# Patient Record
Sex: Female | Born: 1965 | Race: White | Hispanic: No | Marital: Married | Smoking: Never smoker
Health system: Southern US, Community
[De-identification: ages and names within clinical notes are randomized; demographics above are authoritative.]

## PROBLEM LIST (undated history)

## (undated) DIAGNOSIS — Z800 Family history of malignant neoplasm of digestive organs: Secondary | ICD-10-CM

## (undated) DIAGNOSIS — D219 Benign neoplasm of connective and other soft tissue, unspecified: Secondary | ICD-10-CM

## (undated) DIAGNOSIS — R112 Nausea with vomiting, unspecified: Secondary | ICD-10-CM

## (undated) DIAGNOSIS — Z87898 Personal history of other specified conditions: Secondary | ICD-10-CM

## (undated) DIAGNOSIS — Z803 Family history of malignant neoplasm of breast: Secondary | ICD-10-CM

## (undated) DIAGNOSIS — J45909 Unspecified asthma, uncomplicated: Secondary | ICD-10-CM

## (undated) DIAGNOSIS — Z9889 Other specified postprocedural states: Secondary | ICD-10-CM

## (undated) DIAGNOSIS — F32A Depression, unspecified: Secondary | ICD-10-CM

## (undated) DIAGNOSIS — Z8051 Family history of malignant neoplasm of kidney: Secondary | ICD-10-CM

## (undated) DIAGNOSIS — H02409 Unspecified ptosis of unspecified eyelid: Secondary | ICD-10-CM

## (undated) DIAGNOSIS — F419 Anxiety disorder, unspecified: Secondary | ICD-10-CM

## (undated) DIAGNOSIS — I10 Essential (primary) hypertension: Secondary | ICD-10-CM

## (undated) DIAGNOSIS — N83209 Unspecified ovarian cyst, unspecified side: Secondary | ICD-10-CM

## (undated) DIAGNOSIS — Z8719 Personal history of other diseases of the digestive system: Secondary | ICD-10-CM

## (undated) DIAGNOSIS — K219 Gastro-esophageal reflux disease without esophagitis: Secondary | ICD-10-CM

## (undated) DIAGNOSIS — Z973 Presence of spectacles and contact lenses: Secondary | ICD-10-CM

## (undated) HISTORY — DX: Family history of malignant neoplasm of digestive organs: Z80.0

## (undated) HISTORY — DX: Family history of malignant neoplasm of breast: Z80.3

## (undated) HISTORY — DX: Family history of malignant neoplasm of kidney: Z80.51

---

## 2000-09-02 ENCOUNTER — Encounter: Payer: Self-pay | Admitting: Obstetrics and Gynecology

## 2000-09-02 ENCOUNTER — Ambulatory Visit (HOSPITAL_COMMUNITY): Admission: RE | Admit: 2000-09-02 | Discharge: 2000-09-02 | Payer: Self-pay | Admitting: Obstetrics and Gynecology

## 2000-09-30 ENCOUNTER — Encounter: Payer: Self-pay | Admitting: Obstetrics and Gynecology

## 2000-09-30 ENCOUNTER — Ambulatory Visit (HOSPITAL_COMMUNITY): Admission: RE | Admit: 2000-09-30 | Discharge: 2000-09-30 | Payer: Self-pay | Admitting: Obstetrics and Gynecology

## 2000-11-10 ENCOUNTER — Inpatient Hospital Stay (HOSPITAL_COMMUNITY): Admission: AD | Admit: 2000-11-10 | Discharge: 2000-11-10 | Payer: Self-pay | Admitting: Obstetrics and Gynecology

## 2000-12-05 ENCOUNTER — Inpatient Hospital Stay (HOSPITAL_COMMUNITY): Admission: AD | Admit: 2000-12-05 | Discharge: 2000-12-05 | Payer: Self-pay | Admitting: Obstetrics and Gynecology

## 2000-12-06 ENCOUNTER — Encounter: Payer: Self-pay | Admitting: Obstetrics and Gynecology

## 2000-12-06 ENCOUNTER — Ambulatory Visit (HOSPITAL_COMMUNITY): Admission: RE | Admit: 2000-12-06 | Discharge: 2000-12-06 | Payer: Self-pay | Admitting: Obstetrics and Gynecology

## 2000-12-27 ENCOUNTER — Ambulatory Visit (HOSPITAL_COMMUNITY): Admission: RE | Admit: 2000-12-27 | Discharge: 2000-12-27 | Payer: Self-pay | Admitting: Obstetrics and Gynecology

## 2000-12-27 ENCOUNTER — Encounter: Payer: Self-pay | Admitting: Obstetrics and Gynecology

## 2001-01-17 ENCOUNTER — Inpatient Hospital Stay (HOSPITAL_COMMUNITY): Admission: RE | Admit: 2001-01-17 | Discharge: 2001-01-19 | Payer: Self-pay | Admitting: Obstetrics and Gynecology

## 2001-01-17 ENCOUNTER — Encounter: Payer: Self-pay | Admitting: Obstetrics and Gynecology

## 2005-05-26 ENCOUNTER — Other Ambulatory Visit: Admission: RE | Admit: 2005-05-26 | Discharge: 2005-05-26 | Payer: Self-pay | Admitting: Obstetrics and Gynecology

## 2006-08-17 DIAGNOSIS — G839 Paralytic syndrome, unspecified: Secondary | ICD-10-CM

## 2006-08-17 HISTORY — DX: Paralytic syndrome, unspecified: G83.9

## 2006-08-17 HISTORY — PX: TUMOR REMOVAL: SHX12

## 2007-02-23 ENCOUNTER — Encounter: Admission: RE | Admit: 2007-02-23 | Discharge: 2007-02-23 | Payer: Self-pay | Admitting: Otolaryngology

## 2007-05-27 ENCOUNTER — Ambulatory Visit (HOSPITAL_BASED_OUTPATIENT_CLINIC_OR_DEPARTMENT_OTHER): Admission: RE | Admit: 2007-05-27 | Discharge: 2007-05-27 | Payer: Self-pay | Admitting: Otolaryngology

## 2007-12-07 ENCOUNTER — Encounter: Admission: RE | Admit: 2007-12-07 | Discharge: 2007-12-07 | Payer: Self-pay | Admitting: Otolaryngology

## 2008-11-23 ENCOUNTER — Encounter: Admission: RE | Admit: 2008-11-23 | Discharge: 2008-11-23 | Payer: Self-pay | Admitting: Otolaryngology

## 2010-12-30 NOTE — Op Note (Signed)
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   PROCEDURE:  Platinum left upper eyelid implant.   SURGEON:  Lucky Cowboy, MD   ANESTHESIA:  General.   ESTIMATED BLOOD LOSS:  None.   COMPLICATIONS:  None.   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  Although she has redeveloped some tone over the past 2 weeks on  the left side of her face, she remains without ability to close the left  eye resulting from the facial nerve paralysis.  The nerve was intact at  the end of the procedure, but it was very was very weakened and will  need time to recover.  In this interval time, left eye opening and  inability to close is causing her significant disability.  She is having  difficulty driving.  The left eye is constantly blurry and tearing with  frequent drying out and is very painful frequently.  For these reasons,  the above procedure is performed.   FINDINGS:  The patient had a 1.2-g platinum upper eyelid placed on the  left side.   PROCEDURE:  The patient was taken to the operating room and placed on  the table in the supine position.  She was then placed under general  endotracheal anesthesia and the left eye prepped with Betadine and  draped in the usual sterile fashion.  Lubrication was placed in the left  eye to protect the cornea.  A marking pen was used to make the  planned  area of incision in the left upper eyelid crease.  At this point, 1%  lidocaine with 1:100,000 of epinephrine was used to inject the planned  area of incision.  After allowing time for vasoconstrictive effect, a  #15 blade was used to make an incision in the eyelid crease.  The  orbicularis oculi was then divided using the needle-tip Bovie on a  setting of 10.  Once this was performed, the muscle was elevated off of  the tarsal plate.  A pocket was made inferiorly approximately 2 mm  superior to the lash line.  The weight was placed with the middle hole  up to keep this in place.  A 5-0 Monocryl was used to secure this plate  in the 2 lateral position and the superior medial position.  The the  muscle was then reapproximated in a simple interrupted buried fashion  using 5-0 Vicryl.  The skin was  closed  in a running stitch using 6-0 Prolene.  Maxitrol ointment was  applied all over the left eye.  The patient was awakened from anesthesia  and taken to the postanesthesia care unit in stable condition.  There  were no complications.      Lucky Cowboy, MD  Electronically Signed     SJ/MEDQ  D:  05/27/2007  T:  05/28/2007  Job:  [id number]   cc:   [facility]

## 2011-01-02 NOTE — Discharge Summary (Signed)
[facility]  Patient:    Angela Huff, Angela Huff                       MRN: [id number] Adm. Date:  [id number] Disc. Date: [id number] Attending:  Oliver Pila                           Discharge Summary  ADMISSION DIAGNOSES:          Intrauterine pregnancy at 38 weeks, oligohydramnios, group B strep carrier.  DISCHARGE DIAGNOSES:          Intrauterine pregnancy at 38 weeks, oligohydramnios, group B strep carrier.  PROCEDURE:                    Spontaneous vaginal delivery.  COMPLICATIONS:                None.  CONSULTATIONS:                None.  HISTORY AND PHYSICAL:         This is a 45 year old white female gravida 3, para 2-0-0-2 with an EGA of [redacted] weeks with a due date of June 15 by an LMP consistent with a first trimester ultrasound who presents for induction due to an ultrasound on the day of admission revealing oligohydramnios with an AFI of 4.  She has a history of precipitous labors.  Pregnancy complicated by exposure to parvo virus with normal ultrasounds throughout the pregnancy and she is a group B strep carrier.  PRENATAL LABORATORIES:        Blood type O- with negative antibody screen. RPR nonreactive.  Rubella immune.  Hepatitis B surface antigen negative.  HIV negative.  Gonorrhea and chlamydia were also negative.  PAST OBSTETRICAL HISTORY:     In 1994 vaginal delivery 6 pounds 15 ounces at term without complications.  In 1996 vaginal delivery at term 8 pounds 5 ounces without complications.  She only labored for two hours and had a perineal fistula postpartum.  PHYSICAL EXAMINATION  VITAL SIGNS:                  She is afebrile with stable vital signs.  Fetal heart tracing is reactive.  ABDOMEN:                      Gravid and nontender.  PELVIC:                       Cervix was 2, 50, -1.  On Dr. Loleta Books first examination artificial rupture of membranes performed and revealed clear amniotic fluid.  HOSPITAL COURSE:               Patient was admitted and had the above mentioned artificial rupture of membranes and was started on Pitocin.  She progressed to complete two hours after starting Pitocin and pushed well.  On June 3 she had an SVD of a vigorous female infant over an intact perineum with Apgars of 8 and 9 that weighed 6 pounds 13 ounces.  There was a compound presentation with the left arm easily reduced.  Placenta delivered spontaneous, was intact, and had a three vessel cord.  Estimated blood loss was 350 cc.  Cervix, rectum, and vagina were intact.  Postpartum she did very well.  Remained afebrile.  On the morning of postpartum day #2 she was felt to  be stable enough for discharge home.  Early on the morning of postpartum day #2 she had an episode of severe uterine cramping which resolved with Motrin and Percocet and she had previously been doing well.  CONDITION ON DISCHARGE:       Stable.  DISPOSITION:                  Discharged to home.  DIET:                         Regular diet.  ACTIVITY:                     Pelvic rest.  FOLLOW-UP:                    Six weeks.  DISCHARGE MEDICATIONS:        Percocet p.r.n. pain.  DISCHARGE INSTRUCTIONS:       She is given our discharge pamphlet. DD:  01/19/01 TD:  01/19/01 Job: [id number]

## 2011-05-28 LAB — POCT HEMOGLOBIN-HEMACUE
Hemoglobin: 10.9 — ABNORMAL LOW
Operator id: 12362

## 2013-08-17 HISTORY — PX: ENDOMETRIAL ABLATION: SHX621

## 2017-08-17 HISTORY — PX: COLONOSCOPY: SHX174

## 2018-08-17 DIAGNOSIS — C801 Malignant (primary) neoplasm, unspecified: Secondary | ICD-10-CM

## 2018-08-17 HISTORY — PX: MASTECTOMY: SHX3

## 2018-08-17 HISTORY — DX: Malignant (primary) neoplasm, unspecified: C80.1

## 2018-11-09 ENCOUNTER — Other Ambulatory Visit: Payer: Self-pay | Admitting: Obstetrics and Gynecology

## 2018-11-09 DIAGNOSIS — N6310 Unspecified lump in the right breast, unspecified quadrant: Secondary | ICD-10-CM

## 2018-11-14 ENCOUNTER — Ambulatory Visit
Admission: RE | Admit: 2018-11-14 | Discharge: 2018-11-14 | Disposition: A | Discharge status: Home / Self Care | Payer: 59 | Source: Ambulatory Visit | Attending: Obstetrics and Gynecology | Admitting: Obstetrics and Gynecology

## 2018-11-14 ENCOUNTER — Other Ambulatory Visit: Payer: Self-pay | Admitting: Obstetrics and Gynecology

## 2018-11-14 ENCOUNTER — Other Ambulatory Visit: Payer: Self-pay

## 2018-11-14 DIAGNOSIS — R921 Mammographic calcification found on diagnostic imaging of breast: Secondary | ICD-10-CM

## 2018-11-14 DIAGNOSIS — N6310 Unspecified lump in the right breast, unspecified quadrant: Secondary | ICD-10-CM

## 2018-11-15 ENCOUNTER — Ambulatory Visit
Admission: RE | Admit: 2018-11-15 | Discharge: 2018-11-15 | Disposition: A | Discharge status: Home / Self Care | Payer: 59 | Source: Ambulatory Visit | Attending: Obstetrics and Gynecology | Admitting: Obstetrics and Gynecology

## 2018-11-15 ENCOUNTER — Other Ambulatory Visit: Payer: Self-pay | Admitting: Obstetrics and Gynecology

## 2018-11-15 DIAGNOSIS — R921 Mammographic calcification found on diagnostic imaging of breast: Secondary | ICD-10-CM

## 2018-11-15 DIAGNOSIS — Z170 Estrogen receptor positive status [ER+]: Secondary | ICD-10-CM

## 2018-11-15 DIAGNOSIS — C50411 Malignant neoplasm of upper-outer quadrant of right female breast: Secondary | ICD-10-CM | POA: Insufficient documentation

## 2018-11-22 ENCOUNTER — Other Ambulatory Visit: Payer: Self-pay | Admitting: General Surgery

## 2018-11-22 ENCOUNTER — Encounter: Payer: Self-pay | Admitting: Genetic Counselor

## 2018-11-22 DIAGNOSIS — R928 Other abnormal and inconclusive findings on diagnostic imaging of breast: Secondary | ICD-10-CM

## 2018-11-22 DIAGNOSIS — C50411 Malignant neoplasm of upper-outer quadrant of right female breast: Secondary | ICD-10-CM

## 2018-11-22 DIAGNOSIS — Z170 Estrogen receptor positive status [ER+]: Secondary | ICD-10-CM

## 2018-11-23 ENCOUNTER — Encounter: Payer: Self-pay | Admitting: Adult Health

## 2018-11-24 ENCOUNTER — Encounter: Payer: Self-pay | Admitting: *Deleted

## 2018-11-24 ENCOUNTER — Telehealth: Payer: Self-pay | Admitting: Oncology

## 2018-11-24 NOTE — Telephone Encounter (Signed)
A new patient appt has been scheduled for the pt to see Dr. Jana Hakim on 4/13 at 1pm and genetics at 2pm. Pt aware to arrive 15 minutes early.

## 2018-11-25 NOTE — Progress Notes (Signed)
[place]  [phone] [phone]) [phone]    ID: [facility] DOB: December 12, 1965  MR#: [id number]  VEL#:[id number]  Patient Care Team: Lawerance Cruel, MD as PCP - General (Family Medicine) Mauro Kaufmann, RN as Oncology Nurse Navigator Rockwell Germany, RN as Oncology Nurse Navigator Naif Alabi, Virgie Dad, MD as Consulting Physician (Oncology) Stark Klein, MD as Consulting Physician (General Surgery) Paula Compton, MD as Consulting Physician (Obstetrics and Gynecology) Allyn Kenner, MD (Dermatology) OTHER MD:   CHIEF COMPLAINT: Ductal carcinoma in situ right breast  CURRENT TREATMENT: Tamoxifen   HISTORY OF CURRENT ILLNESS: [facility] had routine screening mammography on 11/01/2018 showing a possible abnormality in the right breast. She underwent unilateral right diagnostic mammography with tomography and right breast ultrasonography at [facility] on 11/14/2018 showing: Breast Density Category C. Grouped punctate and amorphous calcifications are confirmed within the lower inner quadrant of the right breast, at posterior depth, measuring 6 mm extent. Additional loosely grouped punctate and amorphous calcifications are confirmed within the upper-outer quadrant of the right breast and central/retroareolar right breast, measuring 4.5 cm greatest extent and 7 mm extent respectively.Targeted ultrasound is performed, evaluating the upper-outer quadrant of the right breast, showing scattered benign cysts, some simple and some complicated with internal debris, largest measuring 7 mm. No suspicious solid or cystic mass is identified by ultrasound. Right axilla was evaluated with ultrasound showing no enlarged or morphologically abnormal lymph nodes.  Accordingly on 11/15/2018 she proceeded to biopsy of the right breast area in question. The pathology from this procedure showed ([id number]): ductal carcinoma in situ, intermediate nuclear grade with  calcifications, arising in a complex sclerosing lesion. Prognostic indicators significant for: estrogen receptor, 95% positive and progesterone receptor, 95% positive, both with strong staining intensity.  An additional biopsy of the right breast was performed on the same day showing: 2. Breast, right, needle core biopsy, lower inner quadrant   - Flat epithelial atypia (FEA) with calcifications.   The patient's subsequent history is as detailed below.   INTERVAL HISTORY: [facility] was evaluated in the breast cancer clinic on 11/28/2018.  Her husband [facility] participated by speaker phone..   The patient met with Dr. Barry Dienes a few days prior to today's meeting and [facility] has been set up for additional breast biopsies.  She also has been set up to meet with genetics later today.   REVIEW OF SYSTEMS: [facility] found a biopsy is very uncomfortable.  Aside from that there were no specific symptoms leading to the original mammogram, which was routinely scheduled. The patient denies unusual headaches, visual changes, nausea, vomiting, stiff neck, dizziness, or gait imbalance. There has been no cough, phlegm production, or pleurisy, no chest pain or pressure, and no change in bowel or bladder habits. The patient denies fever, rash, bleeding, unexplained fatigue or unexplained weight loss. A detailed review of systems was otherwise entirely negative.   PAST MEDICAL HISTORY: Mild allergic asthma  PAST SURGICAL HISTORY: Schwannoma removal left facial nerve area 2008 Placement platinum weight left upper lid  FAMILY HISTORY: Family History  Problem Relation Age of Onset   Breast cancer Maternal Grandmother    Breast cancer Paternal Grandmother    The patient's father died from slowly but relentlessly invasive basal cell carcinoma at the age of 39.  The patient's mother is living as of April 2020, age 24.  The patient has 1 brother who died at the age of 32 from a drug overdose, 2 older brothers, and 1 sister,  with no history of cancer in the immediate family.  The patient's mother's mother was diagnosed with breast cancer at age 78 and died at age 5.  That grandmother had brothers, no sisters.   GYNECOLOGIC HISTORY:  No LMP recorded. 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  Her husband Angela Huff") works in Interior and spatial designer for [employer].  Their children are Angela Huff, 25, who is teaching [place] in [place]; Angela Huff, 23, who is home, and his son Angela Huff 17 who is home.   ADVANCED DIRECTIVES: The patient's husband is her healthcare power of attorney   HEALTH MAINTENANCE: Social History   Tobacco Use   Smoking status: Not on file  Substance Use Topics   Alcohol use: Not on file   Drug use: Not on file    Colonoscopy: [facility], 2019  PAP: Up-to-date/[facility]  Bone density: Never  Allergies no known allergies  Current Outpatient Medications  Medication Sig Dispense Refill   albuterol (PROVENTIL HFA;VENTOLIN HFA) 108 (90 Base) MCG/ACT inhaler albuterol sulfate HFA 90 mcg/actuation aerosol inhaler  INHALE 1 TO 2 PUFFS BY MOUTH EVERY 4 HOURS AS NEEDED     clotrimazole-betamethasone (LOTRISONE) cream clotrimazole-betamethasone 1 %-0.05 % topical cream  APPLY TO THE AFFECTED AND SURROUNDING AREAS OF SKIN BY TOPICAL ROUTE 2 TIMES PER DAY IN THE MORNING AND EVENING FOR 2 WEEKS     hydrochlorothiazide (MICROZIDE) 12.5 MG capsule hydrochlorothiazide 12.5 mg capsule     omeprazole (PRILOSEC OTC) 20 MG tablet Take by mouth.     tamoxifen (NOLVADEX) 20 MG tablet Take 1 tablet (20 mg total) by mouth daily for 30 days. 90 tablet 12   No current facility-administered medications for this visit.       OBJECTIVE: Middle-aged white woman who appears well  Vitals:   11/28/18 1302  BP: (!) 147/94  Pulse: 94  Resp: 20  Temp: 98.7 F (37.1 C)  SpO2: 97%     Body mass index is 29.88 kg/m.   Wt Readings from Last 3 Encounters:  11/28/18 196 lb 8 oz (89.1 kg)      ECOG FS:0 - Asymptomatic  Ocular: Sclerae unicteric, pupils round and equal Ear-nose-throat: Oropharynx clear and moist Lymphatic: No cervical or supraclavicular adenopathy Lungs no rales or rhonchi Heart regular rate and rhythm Abd soft, nontender, positive bowel sounds MSK no focal spinal tenderness, no joint edema Neuro: non-focal, well-oriented, appropriate affect Breasts: The right breast is status post recent biopsy.  There is no ecchymosis.  There is no palpable mass.  There are no skin or nipple changes of concern.  The left breast is benign.  Both axillae are benign.   LAB RESULTS:  CMP  No results found for: NA, K, CL, CO2, GLUCOSE, BUN, CREATININE, CALCIUM, PROT, ALBUMIN, AST, ALT, ALKPHOS, BILITOT, GFRNONAA, GFRAA  No results found for: TOTALPROTELP, ALBUMINELP, A1GS, A2GS, BETS, BETA2SER, GAMS, MSPIKE, SPEI  No results found for: KPAFRELGTCHN, LAMBDASER, KAPLAMBRATIO  Lab Results  Component Value Date   HGB 10.9 (L) 05/27/2007    @LASTCHEMISTRY @  No results found for: LABCA2  No components found for: [id number]  No results for input(s): INR in the last 168 hours.  No results found for: LABCA2  No results found for: BJY782  No results found for: NFA213  No results found for: YQM578  No results found for: CA2729  No components found for: HGQUANT  No results found for: CEA1 / No results found for: CEA1   No results found for: AFPTUMOR  No results found for: CHROMOGRNA  No results found for: PSA1  No visits with results within 3 Day(s) from this visit.  Latest known visit with results is:  Hospital Outpatient Visit on 05/27/2007  Component Date Value Ref Range Status    Operator id 05/27/2007 [id number]   Final   Hemoglobin 05/27/2007 10.9*  Final    (this displays the last labs from the last 3 days)  No results found for: TOTALPROTELP, ALBUMINELP, A1GS, A2GS, BETS, BETA2SER, GAMS, MSPIKE, SPEI (this displays SPEP labs)  No results found for: KPAFRELGTCHN, LAMBDASER, KAPLAMBRATIO (kappa/lambda light chains)  No results found for: HGBA, HGBA2QUANT, HGBFQUANT, HGBSQUAN (Hemoglobinopathy evaluation)   No results found for: LDH  No results found for: IRON, TIBC, IRONPCTSAT (Iron and TIBC)  No results found for: FERRITIN  Urinalysis No results found for: COLORURINE, APPEARANCEUR, LABSPEC, PHURINE, GLUCOSEU, HGBUR, BILIRUBINUR, KETONESUR, PROTEINUR, UROBILINOGEN, NITRITE, LEUKOCYTESUR   STUDIES:  US Breast Ltd Uni Right Inc Axilla  Result Date: 11/14/2018 CLINICAL DATA:  Patient returns today to evaluate possible mass and calcifications within the RIGHT breast identified on recent screening mammogram. EXAM: DIGITAL DIAGNOSTIC RIGHT MAMMOGRAM WITH CAD AND TOMO ULTRASOUND RIGHT BREAST COMPARISON:  Previous exams including recent screening mammogram dated 11/01/2018. ACR Breast Density Category c: The breast tissue is heterogeneously dense, which may obscure small masses. FINDINGS: Grouped punctate and amorphous calcifications are confirmed within the lower inner quadrant of the RIGHT breast, at posterior depth, measuring 6 mm extent. Additional loosely grouped punctate and amorphous calcifications are confirmed within the upper-outer quadrant of the RIGHT breast and central/retroareolar RIGHT breast, measuring 4.5 cm greatest extent and 7 mm extent respectively. Mammographic images were processed with CAD. Targeted ultrasound is performed, evaluating the upper-outer quadrant of the RIGHT breast, showing scattered benign cysts, some simple and some complicated with internal debris, largest measuring 7 mm. No suspicious solid or cystic mass is identified by  ultrasound. RIGHT axilla was evaluated with ultrasound showing no enlarged or morphologically abnormal lymph nodes. IMPRESSION: 1. Indeterminate calcifications within the upper-outer quadrant of the RIGHT breast. These calcifications span 4.5 cm greatest dimension. Given the scattered cysts identified by ultrasound in the upper-outer quadrant of the RIGHT breast, these calcifications may represent benign fibrocystic change. Stereotactic biopsy is recommended. 2. Additional indeterminate calcifications within the lower inner quadrant of the RIGHT breast, at far posterior depth, measuring 6 mm extent. Stereotactic biopsy is also recommended for these calcifications. RECOMMENDATION: 1. Stereotactic biopsy for the indeterminate calcifications within the upper-outer quadrant of the RIGHT breast. If benign fibrocystic change, would then recommend follow-up RIGHT breast diagnostic mammogram in 6 months to ensure stability of additional surrounding calcifications in the upper-outer quadrant and retroareolar RIGHT breast. 2. Stereotactic biopsy for the indeterminate calcifications within the lower inner quadrant of the RIGHT breast. Stereotactic biopsies are scheduled for March 31st. I have discussed the findings and recommendations with the patient. Results were also provided in writing at the conclusion of the visit. If applicable, a reminder letter will be sent to the patient regarding the next appointment. BI-RADS CATEGORY  4: Suspicious. Electronically Signed   By: Franki Cabot M.D.   On: 11/14/2018 12:12   Mm Diag Breast Tomo Uni Right  Result  Date: 11/14/2018 CLINICAL DATA:  Patient returns today to evaluate possible mass and calcifications within the RIGHT breast identified on recent screening mammogram. EXAM: DIGITAL DIAGNOSTIC RIGHT MAMMOGRAM WITH CAD AND TOMO ULTRASOUND RIGHT BREAST COMPARISON:  Previous exams including recent screening mammogram dated 11/01/2018. ACR Breast Density Category c: The breast  tissue is heterogeneously dense, which may obscure small masses. FINDINGS: Grouped punctate and amorphous calcifications are confirmed within the lower inner quadrant of the RIGHT breast, at posterior depth, measuring 6 mm extent. Additional loosely grouped punctate and amorphous calcifications are confirmed within the upper-outer quadrant of the RIGHT breast and central/retroareolar RIGHT breast, measuring 4.5 cm greatest extent and 7 mm extent respectively. Mammographic images were processed with CAD. Targeted ultrasound is performed, evaluating the upper-outer quadrant of the RIGHT breast, showing scattered benign cysts, some simple and some complicated with internal debris, largest measuring 7 mm. No suspicious solid or cystic mass is identified by ultrasound. RIGHT axilla was evaluated with ultrasound showing no enlarged or morphologically abnormal lymph nodes. IMPRESSION: 1. Indeterminate calcifications within the upper-outer quadrant of the RIGHT breast. These calcifications span 4.5 cm greatest dimension. Given the scattered cysts identified by ultrasound in the upper-outer quadrant of the RIGHT breast, these calcifications may represent benign fibrocystic change. Stereotactic biopsy is recommended. 2. Additional indeterminate calcifications within the lower inner quadrant of the RIGHT breast, at far posterior depth, measuring 6 mm extent. Stereotactic biopsy is also recommended for these calcifications. RECOMMENDATION: 1. Stereotactic biopsy for the indeterminate calcifications within the upper-outer quadrant of the RIGHT breast. If benign fibrocystic change, would then recommend follow-up RIGHT breast diagnostic mammogram in 6 months to ensure stability of additional surrounding calcifications in the upper-outer quadrant and retroareolar RIGHT breast. 2. Stereotactic biopsy for the indeterminate calcifications within the lower inner quadrant of the RIGHT breast. Stereotactic biopsies are scheduled for  March 31st. I have discussed the findings and recommendations with the patient. Results were also provided in writing at the conclusion of the visit. If applicable, a reminder letter will be sent to the patient regarding the next appointment. BI-RADS CATEGORY  4: Suspicious. Electronically Signed   By: Franki Cabot M.D.   On: 11/14/2018 12:12   Mm Clip Placement Right  Result Date: 11/15/2018 CLINICAL DATA:  Status post stereotactic biopsies for 2 sites of calcifications within the RIGHT breast. EXAM: DIAGNOSTIC RIGHT MAMMOGRAM POST STEREOTACTIC BIOPSY x2 COMPARISON:  Previous exam(s). FINDINGS: Mammographic images were obtained following stereotactic guided biopsy of 2 sites of calcifications within the RIGHT breast. Coil shaped clip is appropriately positioned within the upper-outer quadrant. X shaped clip is appropriately positioned within the lower inner quadrant. IMPRESSION: 1. Coil shaped clip is appropriately positioned within the upper-outer quadrant of the RIGHT breast. 2. X shaped clip is appropriately positioned within the lower inner quadrant of the RIGHT breast. Final Assessment: Post Procedure Mammograms for Marker Placement Electronically Signed   By: Franki Cabot M.D.   On: 11/15/2018 11:53   Mm Rt Breast Bx W Loc Dev 1st Lesion Image Bx Spec Stereo Guide  Addendum Date: 11/17/2018   ADDENDUM REPORT: 11/17/2018 13:54 ADDENDUM: Pathology revealed DUCTAL CARCINOMA IN SITU, INTERMEDIATE NUCLEAR GRADE WITH CALCIFICATIONS, ARISING IN A COMPLEX SCLEROSING LESION of the RIGHT breast, upper outer quadrant. This was found to be concordant by Dr. Franki Cabot. Pathology revealed FLAT EPITHELIAL ATYPIA (FEA) WITH CALCIFICATIONS of the RIGHT breast, lower inner quadrant. This was found to be concordant by Dr. Franki Cabot. Pathology results were discussed with the patient by  telephone. The patient reported doing well after the biopsy with tenderness at the site. Post biopsy instructions and care were  reviewed and questions were answered. The patient was encouraged to call [facility] for any additional concerns. If breast conservation therapy is considered, at least 2 additional stereotactic biopsies are recommended for other similar appearing calcifications in the RIGHT breast. Surgical consultation has been arranged with Dr. Stark Klein at [facility] on November 22, 2018. Pathology results reported by Stacie Acres, RN on 11/17/2018. Electronically Signed   By: Franki Cabot M.D.   On: 11/17/2018 13:54   Result Date: 11/17/2018 CLINICAL DATA:  Patient with 2 groups of calcifications within the RIGHT breast presents today for stereotactic biopsies. EXAM: RIGHT BREAST STEREOTACTIC CORE NEEDLE BIOPSY x2 COMPARISON:  Previous exams. FINDINGS: The patient and I discussed the procedure of stereotactic-guided biopsy including benefits and alternatives. We discussed the high likelihood of a successful procedure. We discussed the risks of the procedure including infection, bleeding, tissue injury, clip migration, and inadequate sampling. Informed written consent was given. The usual time out protocol was performed immediately prior to the procedure. Site 1: Using sterile technique and 1% Lidocaine as local anesthetic, under stereotactic guidance, a 9 gauge vacuum assisted device was used to perform core needle biopsy of calcifications in the upper outer quadrant of the RIGHT breast using a lateral approach. Specimen radiograph was performed showing calcifications. Specimens with calcifications are identified for pathology. Lesion quadrant: Upper outer quadrant At the conclusion of the procedure, a coil shaped tissue marker clip was deployed into the biopsy cavity. Site 2: Next, using sterile technique and 1% Lidocaine as local anesthetic, under stereotactic guidance, a 9 gauge vacuum assisted device was used to perform core needle biopsy of calcifications in the lower inner quadrant  of the RIGHT breast using a medial approach. Specimen radiograph was performed showing calcifications. Specimens with calcifications are identified for pathology. Lesion quadrant: Lower inner quadrant At the conclusion of the procedure, a X shaped tissue marker clip was deployed into the biopsy cavity. Follow-up 2-view mammogram was performed and dictated separately. IMPRESSION: 1. Stereotactic guided biopsy of calcifications within the upper-outer quadrant of the RIGHT breast. Coil shaped clip placed at the biopsy site. 2. Stereotactic guided biopsy of calcifications within the lower inner quadrant of the RIGHT breast. X shaped clip placed at the biopsy site. Electronically Signed: By: Franki Cabot M.D. On: 11/15/2018 11:52   Mm Rt Breast Bx W Loc Dev Ea Ad Lesion Img Bx Spec Stereo Guide  Addendum Date: 11/17/2018   ADDENDUM REPORT: 11/17/2018 13:54 ADDENDUM: Pathology revealed DUCTAL CARCINOMA IN SITU, INTERMEDIATE NUCLEAR GRADE WITH CALCIFICATIONS, ARISING IN A COMPLEX SCLEROSING LESION of the RIGHT breast, upper outer quadrant. This was found to be concordant by Dr. Franki Cabot. Pathology revealed FLAT EPITHELIAL ATYPIA (FEA) WITH CALCIFICATIONS of the RIGHT breast, lower inner quadrant. This was found to be concordant by Dr. Franki Cabot. Pathology results were discussed with the patient by telephone. The patient reported doing well after the biopsy with tenderness at the site. Post biopsy instructions and care were reviewed and questions were answered. The patient was encouraged to call [facility] for any additional concerns. If breast conservation therapy is considered, at least 2 additional stereotactic biopsies are recommended for other similar appearing calcifications in the RIGHT breast. Surgical consultation has been arranged with Dr. Stark Klein at [facility] on November 22, 2018. Pathology results reported by  Stacie Acres, RN on 11/17/2018. Electronically  Signed   By: Franki Cabot M.D.   On: 11/17/2018 13:54   Result Date: 11/17/2018 CLINICAL DATA:  Patient with 2 groups of calcifications within the RIGHT breast presents today for stereotactic biopsies. EXAM: RIGHT BREAST STEREOTACTIC CORE NEEDLE BIOPSY x2 COMPARISON:  Previous exams. FINDINGS: The patient and I discussed the procedure of stereotactic-guided biopsy including benefits and alternatives. We discussed the high likelihood of a successful procedure. We discussed the risks of the procedure including infection, bleeding, tissue injury, clip migration, and inadequate sampling. Informed written consent was given. The usual time out protocol was performed immediately prior to the procedure. Site 1: Using sterile technique and 1% Lidocaine as local anesthetic, under stereotactic guidance, a 9 gauge vacuum assisted device was used to perform core needle biopsy of calcifications in the upper outer quadrant of the RIGHT breast using a lateral approach. Specimen radiograph was performed showing calcifications. Specimens with calcifications are identified for pathology. Lesion quadrant: Upper outer quadrant At the conclusion of the procedure, a coil shaped tissue marker clip was deployed into the biopsy cavity. Site 2: Next, using sterile technique and 1% Lidocaine as local anesthetic, under stereotactic guidance, a 9 gauge vacuum assisted device was used to perform core needle biopsy of calcifications in the lower inner quadrant of the RIGHT breast using a medial approach. Specimen radiograph was performed showing calcifications. Specimens with calcifications are identified for pathology. Lesion quadrant: Lower inner quadrant At the conclusion of the procedure, a X shaped tissue marker clip was deployed into the biopsy cavity. Follow-up 2-view mammogram was performed and dictated separately. IMPRESSION: 1. Stereotactic guided biopsy of calcifications within the upper-outer quadrant of the RIGHT breast. Coil shaped  clip placed at the biopsy site. 2. Stereotactic guided biopsy of calcifications within the lower inner quadrant of the RIGHT breast. X shaped clip placed at the biopsy site. Electronically Signed: By: Franki Cabot M.D. On: 11/15/2018 11:52     ELIGIBLE FOR AVAILABLE RESEARCH PROTOCOL: NO   ASSESSMENT: 53 y.o. [place], [place] woman status post right breast upper outer quadrant biopsy 11/15/2018 showing ductal carcinoma in situ, grade 2, strongly estrogen and progesterone receptor positive  (a) biopsy of a lower inner quadrant area of calcifications 11/15/2018 showed flat epithelial atypia  (1) genetics testing 11/28/2018  (2) tamoxifen started 11/28/2018  (3) definitive surgery to follow  (4) adjuvant radiation to follow surgery   PLAN: I spent approximately 60 minutes face to face with Kayren with more than 50% of that time spent in counseling and coordination of care. Specifically we reviewed the biology of the patient's diagnosis and the specifics of her situation.  Bernadene understands that in noninvasive ductal carcinoma, also called ductal carcinoma in situ ("DCIS") the breast cancer cells remain trapped in the ducts were they started. They cannot travel to a vital organ. For that reason these cancers in themselves are not life-threatening.  If the whole breast is removed then all the ducts are removed and since the cancer cells are trapped in the ducts, the cure rate with mastectomy for noninvasive breast cancer is approximately 99%. Nevertheless we recommend lumpectomy, because there is no survival advantage to mastectomy and because the cosmetic result is generally superior with breast conservation.  Since the patient is keeping her breasts, there will be some risk of recurrence. The recurrence can only be in the same breast since, again, the cells are trapped in the ducts. There is no connection from one breast to the  other. The risk of local recurrence is cut by more than half with  radiation, which is standard in this situation.  In estrogen receptor positive cancers like Dasha's, anti-estrogens can also be considered. They will further reduce the risk of recurrence by one half. In addition anti-estrogens will lower the risk of a Angela breast cancer developing in either breast, also by one half.   [facility] also qualifies for genetics testing. In patients who carry a deleterious mutation [for example in a  BRCA gene], the risk of a Angela breast cancer developing in the future may be sufficiently great that the patient may choose bilateral mastectomies. However if she wishes to keep her breasts in that situation it is safe to do so. That would require intensified screening, which generally means not only yearly mammography but a yearly breast MRI as well.   Because of the current pandemic, the patient's surgery and radiation are likely to be delayed.  Accordingly we discussed antiestrogens today.  Since the patient is still premenopausal this will be tamoxifen and today we discussed the possible toxicities side effects and complications of this agent in detail including the low but non-0 risk of blood clots and uterine cancer.  The patient is very interested in starting and the prescription has been entered.  I am going to do a WebEx visit with Angela Huff in about 6 weeks just to make sure she is tolerating tamoxifen well.  I will then see her again once she completes her surgery and radiation and at that point we will set her up for yearly follow-up  Barbar has a good understanding of the overall plan. She agrees with it. She knows the goal of treatment in her case is cure. She will call with any problems that may develop before her next visit here.    Adrijana Haros, Virgie Dad, MD  11/28/18 2:21 PM Medical Oncology and Hematology [facility] [address] [place], [place] [address] Tel. [phone]    Fax. [phone]   I, Jacqualyn Posey am acting as a Education administrator for Chauncey Cruel, MD.   I, Lurline Del MD, have reviewed the above documentation for accuracy and completeness, and I agree with the above.

## 2018-11-28 ENCOUNTER — Inpatient Hospital Stay: Payer: 59 | Attending: Oncology | Admitting: Oncology

## 2018-11-28 ENCOUNTER — Inpatient Hospital Stay: Payer: 59 | Admitting: Genetic Counselor

## 2018-11-28 ENCOUNTER — Other Ambulatory Visit: Payer: Self-pay

## 2018-11-28 ENCOUNTER — Encounter: Payer: Self-pay | Admitting: *Deleted

## 2018-11-28 ENCOUNTER — Other Ambulatory Visit: Payer: 59

## 2018-11-28 ENCOUNTER — Encounter: Payer: 59 | Admitting: Genetic Counselor

## 2018-11-28 ENCOUNTER — Inpatient Hospital Stay: Payer: 59

## 2018-11-28 VITALS — BP 147/94 | HR 94 | Temp 98.7°F | Resp 20 | Ht 68.0 in | Wt 196.5 lb

## 2018-11-28 DIAGNOSIS — D0511 Intraductal carcinoma in situ of right breast: Secondary | ICD-10-CM | POA: Diagnosis not present

## 2018-11-28 DIAGNOSIS — Z800 Family history of malignant neoplasm of digestive organs: Secondary | ICD-10-CM

## 2018-11-28 DIAGNOSIS — C50411 Malignant neoplasm of upper-outer quadrant of right female breast: Secondary | ICD-10-CM

## 2018-11-28 DIAGNOSIS — Z808 Family history of malignant neoplasm of other organs or systems: Secondary | ICD-10-CM | POA: Insufficient documentation

## 2018-11-28 DIAGNOSIS — Z803 Family history of malignant neoplasm of breast: Secondary | ICD-10-CM

## 2018-11-28 DIAGNOSIS — Z170 Estrogen receptor positive status [ER+]: Secondary | ICD-10-CM | POA: Diagnosis not present

## 2018-11-28 DIAGNOSIS — Z79899 Other long term (current) drug therapy: Secondary | ICD-10-CM | POA: Insufficient documentation

## 2018-11-28 DIAGNOSIS — Z8051 Family history of malignant neoplasm of kidney: Secondary | ICD-10-CM

## 2018-11-28 MED ORDER — TAMOXIFEN CITRATE 20 MG PO TABS: 20.0000 mg | ORAL_TABLET | Freq: Every day | ORAL | 12 refills | Status: AC

## 2018-11-29 ENCOUNTER — Encounter: Payer: Self-pay | Admitting: Genetic Counselor

## 2018-11-29 DIAGNOSIS — Z800 Family history of malignant neoplasm of digestive organs: Secondary | ICD-10-CM | POA: Insufficient documentation

## 2018-11-29 DIAGNOSIS — Z8051 Family history of malignant neoplasm of kidney: Secondary | ICD-10-CM | POA: Insufficient documentation

## 2018-11-29 DIAGNOSIS — Z803 Family history of malignant neoplasm of breast: Secondary | ICD-10-CM | POA: Insufficient documentation

## 2018-11-29 NOTE — Progress Notes (Signed)
REFERRING PROVIDER: Stark Klein, MD [address] [place], [place] [address]  PRIMARY PROVIDER:  Lawerance Cruel, MD  PRIMARY REASON FOR VISIT:  1. Malignant neoplasm of upper-outer quadrant of right breast in female, estrogen receptor positive ([place])   2. Family history of breast cancer   3. Family history of stomach cancer   4. Family history of kidney cancer      HISTORY OF PRESENT ILLNESS:  I connected with Ms. Sandora on 11/29/2018 at 3:00 PM EDT by Webex video conference and verified that I am speaking with the correct person using two identifiers.   Ms. Mukherjee, a 53 y.o. female, was seen for a [facility] cancer genetics consultation at the request of Dr. Barry Dienes due to a personal and family history of cancer.  Ms. Neria presents to clinic today to discuss the possibility of a hereditary predisposition to cancer, genetic testing, and to further clarify her future cancer risks, as well as potential cancer risks for family members.   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 The treatment plan includes anti-estrogen and lumpectomy once the pandemic concern has lessened and elective surgeries are reinstated.    CANCER HISTORY:    Malignant neoplasm of upper-outer quadrant of right breast in female, estrogen receptor positive ([place])   11/15/2018 Initial Diagnosis    Malignant neoplasm of upper-outer quadrant of right breast in female, estrogen receptor positive ([place])    11/23/2018 Cancer Staging    Staging form: Breast, AJCC 8th Edition - Clinical: Stage 0 (cTis (DCIS), cN0, cM0, ER+, PR+) - Signed by Gardenia Phlegm, NP on 11/23/2018      RISK FACTORS:  Menarche was at age 11.  First live birth at age 44.  OCP use for approximately 0 years.  Ovaries intact: no.  Hysterectomy: no.  Menopausal status: perimenopausal.  HRT use: 0 years. Colonoscopy: Yes; normal. Mammogram within the last year: yes. Number of  breast biopsies: 1. Up to date with pelvic exams: yes. Any excessive radiation exposure in the past: no  Past Medical History:  Diagnosis Date   Family history of breast cancer    Family history of kidney cancer    Family history of stomach cancer      Social History   Socioeconomic History   Marital status: Married    Spouse name: Not on file   Number of children: Not on file   Years of education: Not on file   Highest education level: Not on file  Occupational History   Not on file  Social Needs   Financial resource strain: Not on file   Food insecurity:    Worry: Not on file    Inability: Not on file   Transportation needs:    Medical: Not on file    Non-medical: Not on file  Tobacco Use   Smoking status: Not on file  Substance and Sexual Activity   Alcohol use: Not on file   Drug use: Not on file   Sexual activity: Not on file  Lifestyle   Physical activity:    Days per week: Not on file    Minutes per session: Not on file   Stress: Not on file  Relationships   Social connections:    Talks on phone: Not on file    Gets together: Not on file    Attends religious service: Not on file    Active member of club  or organization: Not on file    Attends meetings of clubs or organizations: Not on file    Relationship status: Not on file  Other Topics Concern   Not on file  Social History Narrative   Not on file     FAMILY HISTORY:  We obtained a detailed, 4-generation family history.  Significant diagnoses are listed below: Family History  Problem Relation Age of Onset   Breast cancer Maternal Grandmother 32       d. 4   Stomach cancer Paternal Grandmother 34   Skin cancer Paternal Grandmother    Skin cancer Father 25       d. 76   Kidney cancer Brother    Heart Problems Paternal Uncle    Drug abuse Brother        d. 36   Breast cancer Maternal Great-grandmother 37       MGF's mother    The patient has three children,  two daughters and a son, who are all cancer free.  She has three brothers and a sister.  One brother had kidney cancer in his 41's, and one brother died from a drug overdose.  Her mother is living and father is deceased.  The patient's mother has two brothers who are cancer free.  Her parents are both deceased.  Her mother was diagnosed with breast cancer at 13 and died at 46.  Her father died of heart disease in his 3's.  The maternal grandfather's mother had breast cancer at age 87.  The patient's father died of an aggressive basal cell carcinoma that developed at age 80 and he died at 92.  He had a sister and brother, and a maternal half brother.  None of his siblings had cancer.  His mother had stomach and skin cancer.  Ms. Alcivar is unaware of previous family history of genetic testing for hereditary cancer risks. Patient's maternal ancestors are of [place] descent, and paternal ancestors are of [place], [place] and [place] descent. There is no reported Ashkenazi Jewish ancestry. There is no known consanguinity.  GENETIC COUNSELING ASSESSMENT: Ms. Boateng is a 53 y.o. female with a personal and family history of cancer which is somewhat suggestive of a hereditary cancer syndrome and predisposition to cancer. We, therefore, discussed and recommended the following at today's visit.   DISCUSSION: We discussed that 5 - 10% of breast cancer is hereditary, with most cases associated with BRCA mutations.  There are other genes that can be associated with hereditary breast cancer syndromes.  She also has a family history of young kidney cancer in her brother and stomach cancer in her grandmother.  Both of these cancers can be seen in Lynch syndrome.  In some families, breast cancer can also be seen in Lynch syndrome.    We reviewed the characteristics, features and inheritance patterns of hereditary cancer syndromes. We also discussed genetic testing, including the appropriate family members to test, the  process of testing, insurance coverage and turn-around-time for results. We discussed the implications of a negative, positive and/or variant of uncertain significant result. We recommended Ms. Keir pursue genetic testing for the Multi-cancer gene panel. The Multi-Gene Panel offered by [facility] includes sequencing and/or deletion duplication testing of the following 85 genes: AIP, ALK, APC, ATM, AXIN2,BAP1,  BARD1, BLM, BMPR1A, BRCA1, BRCA2, BRIP1, CASR, CDC73, CDH1, CDK4, CDKN1B, CDKN1C, CDKN2A (p14ARF), CDKN2A (p16INK4a), CEBPA, CHEK2, CTNNA1, DICER1, DIS3L2, EGFR (c.2369C>T, p.Thr790Met variant only), EPCAM (Deletion/duplication testing only), FH, FLCN, GATA2, GPC3, GREM1 (Promoter region deletion/duplication  testing only), HOXB13 (c.251G>A, p.Gly84Glu), HRAS, KIT, MAX, MEN1, MET, MITF (c.952G>A, p.Glu318Lys variant only), MLH1, MSH2, MSH3, MSH6, MUTYH, NBN, NF1, NF2, NTHL1, PALB2, PDGFRA, PHOX2B, PMS2, POLD1, POLE, POT1, PRKAR1A, PTCH1, PTEN, RAD50, RAD51C, RAD51D, RB1, RECQL4, RET, RNF43, RUNX1, SDHAF2, SDHA (sequence changes only), SDHB, SDHC, SDHD, SMAD4, SMARCA4, SMARCB1, SMARCE1, STK11, SUFU, TERC, TERT, TMEM127, TP53, TSC1, TSC2, VHL, WRN and WT1.    Based on Ms. Simonin's personal and family history of cancer, she meets medical criteria for genetic testing. Despite that she meets criteria, she may still have an out of pocket cost. We discussed that if her out of pocket cost for testing is over $100, the laboratory will call and confirm whether she wants to proceed with testing.  If the out of pocket cost of testing is less than $100 she will be billed by the genetic testing laboratory.   In order to estimate her chance of having a BRCA mutation, we used statistical models (Penn II) that consider her personal medical history, family history and ancestry.  Because each model is different, there can be a lot of variability in the risks they give.  Therefore, these numbers must be considered a rough range  and not a precise risk of having a BRCA mutation.  This model estimates that she has approximately a 15% chance of having a mutation. Based on this assessment of her family and personal history, genetic testing is recommended.  PLAN: After considering the risks, benefits, and limitations, Ms. Rozar provided informed consent to pursue genetic testing and the blood sample was sent to [facility] for analysis of the Multi-cancer panel. Results should be available within approximately 2-3 weeks' time, at which point they will be disclosed by telephone to Ms. Penny, as will any additional recommendations warranted by these results. Ms. Ewart will receive a summary of her genetic counseling visit and a copy of her results once available. This information will also be available in Epic.   Lastly, we encouraged Ms. Toops to remain in contact with cancer genetics annually so that we can continuously update the family history and inform her of any changes in cancer genetics and testing that may be of benefit for this family.   Ms. Snavely questions were answered to her satisfaction today. Our contact information was provided should additional questions or concerns arise. Thank you for the referral and allowing [place] to share in the care of your patient.   Amari Zagal P. Florene Glen, [place], [facility] Certified Genetic Counselor [email or web address] phone: [phone]  The patient was seen for a total of 42 minutes in face-to-face genetic counseling.  This patient was discussed with Drs. Magrinat, Lindi Adie and/or Burr Medico who agrees with the above.    _______________________________________________________________________ For Office Staff:  Number of people involved in session: 2 Was an Intern/ student involved with case: yes: [employer]

## 2018-12-06 ENCOUNTER — Ambulatory Visit: Payer: Self-pay | Admitting: Genetic Counselor

## 2018-12-06 ENCOUNTER — Telehealth: Payer: Self-pay | Admitting: Genetic Counselor

## 2018-12-06 ENCOUNTER — Encounter: Payer: Self-pay | Admitting: Genetic Counselor

## 2018-12-06 DIAGNOSIS — Z1379 Encounter for other screening for genetic and chromosomal anomalies: Secondary | ICD-10-CM

## 2018-12-06 NOTE — Progress Notes (Signed)
HPI:  Ms. Wawrzyniak was previously seen in the [facility] due to a personal and family history of cancer and concerns regarding a hereditary predisposition to cancer. Please refer to our prior cancer genetics clinic note for more information regarding our discussion, assessment and recommendations, at the time. Ms. Adan recent genetic test results were disclosed to her, as were recommendations warranted by these results. These results and recommendations are discussed in more detail below.  CANCER HISTORY:    Malignant neoplasm of upper-outer quadrant of right breast in female, estrogen receptor positive ([place])   11/15/2018 Initial Diagnosis    Malignant neoplasm of upper-outer quadrant of right breast in female, estrogen receptor positive ([place])    11/23/2018 Cancer Staging    Staging form: Breast, AJCC 8th Edition - Clinical: Stage 0 (cTis (DCIS), cN0, cM0, ER+, PR+) - Signed by Gardenia Phlegm, NP on 11/23/2018    12/05/2018 Genetic Testing    Negative genetic testing.  CASR c.1211T>G VUS identified.  The Multi-Gene Panel offered by [facility] includes sequencing and/or deletion duplication testing of the following 85 genes: AIP, ALK, APC, ATM, AXIN2,BAP1,  BARD1, BLM, BMPR1A, BRCA1, BRCA2, BRIP1, CASR, CDC73, CDH1, CDK4, CDKN1B, CDKN1C, CDKN2A (p14ARF), CDKN2A (p16INK4a), CEBPA, CHEK2, CTNNA1, DICER1, DIS3L2, EGFR (c.2369C>T, p.Thr790Met variant only), EPCAM (Deletion/duplication testing only), FH, FLCN, GATA2, GPC3, GREM1 (Promoter region deletion/duplication testing only), HOXB13 (c.251G>A, p.Gly84Glu), HRAS, KIT, MAX, MEN1, MET, MITF (c.952G>A, p.Glu318Lys variant only), MLH1, MSH2, MSH3, MSH6, MUTYH, NBN, NF1, NF2, NTHL1, PALB2, PDGFRA, PHOX2B, PMS2, POLD1, POLE, POT1, PRKAR1A, PTCH1, PTEN, RAD50, RAD51C, RAD51D, RB1, RECQL4, RET, RNF43, RUNX1, SDHAF2, SDHA (sequence changes only), SDHB, SDHC, SDHD, SMAD4, SMARCA4, SMARCB1, SMARCE1, STK11, SUFU, TERC, TERT, TMEM127, TP53,  TSC1, TSC2, VHL, WRN and WT1.  The report date is December 05, 2018.     FAMILY HISTORY:  We obtained a detailed, 4-generation family history.  Significant diagnoses are listed below: Family History  Problem Relation Age of Onset   Breast cancer Maternal Grandmother 32       d. 55   Stomach cancer Paternal Grandmother 13   Skin cancer Paternal Grandmother    Skin cancer Father 59       d. 4   Kidney cancer Brother    Heart Problems Paternal Uncle    Drug abuse Brother        d. 37   Breast cancer Maternal Great-grandmother 69       MGF's mother    The patient has three children, two daughters and a son, who are all cancer free.  She has three brothers and a sister.  One brother had kidney cancer in his 3's, and one brother died from a drug overdose.  Her mother is living and father is deceased.  The patient's mother has two brothers who are cancer free.  Her parents are both deceased.  Her mother was diagnosed with breast cancer at 49 and died at 51.  Her father died of heart disease in his 61's.  The maternal grandfather's mother had breast cancer at age 54.  The patient's father died of an aggressive basal cell carcinoma that developed at age 76 and he died at 77.  He had a sister and brother, and a maternal half brother.  None of his siblings had cancer.  His mother had stomach and skin cancer.  Ms. Cerra is unaware of previous family history of genetic testing for hereditary cancer risks. Patient's maternal ancestors are of [place] descent, and paternal ancestors  are of [place], [place] and [place] descent. There is no reported Ashkenazi Jewish ancestry. There is no known consanguinity.   GENETIC TEST RESULTS: Genetic testing reported out on December 05, 2018 through the Multi-cancer panel found no pathogenic mutations. The Multi-Gene Panel offered by [facility] includes sequencing and/or deletion duplication testing of the following 85 genes: AIP, ALK, APC, ATM, AXIN2,BAP1,   BARD1, BLM, BMPR1A, BRCA1, BRCA2, BRIP1, CASR, CDC73, CDH1, CDK4, CDKN1B, CDKN1C, CDKN2A (p14ARF), CDKN2A (p16INK4a), CEBPA, CHEK2, CTNNA1, DICER1, DIS3L2, EGFR (c.2369C>T, p.Thr790Met variant only), EPCAM (Deletion/duplication testing only), FH, FLCN, GATA2, GPC3, GREM1 (Promoter region deletion/duplication testing only), HOXB13 (c.251G>A, p.Gly84Glu), HRAS, KIT, MAX, MEN1, MET, MITF (c.952G>A, p.Glu318Lys variant only), MLH1, MSH2, MSH3, MSH6, MUTYH, NBN, NF1, NF2, NTHL1, PALB2, PDGFRA, PHOX2B, PMS2, POLD1, POLE, POT1, PRKAR1A, PTCH1, PTEN, RAD50, RAD51C, RAD51D, RB1, RECQL4, RET, RNF43, RUNX1, SDHAF2, SDHA (sequence changes only), SDHB, SDHC, SDHD, SMAD4, SMARCA4, SMARCB1, SMARCE1, STK11, SUFU, TERC, TERT, TMEM127, TP53, TSC1, TSC2, VHL, WRN and WT1.  The test report has been scanned into EPIC and is located under the Molecular Pathology section of the Results Review tab.  A portion of the result report is included below for reference.     We discussed with Ms. Lewellyn that because current genetic testing is not perfect, it is possible there may be a gene mutation in one of these genes that current testing cannot detect, but that chance is small.  We also discussed, that there could be another gene that has not yet been discovered, or that we have not yet tested, that is responsible for the cancer diagnoses in the family. It is also possible there is a hereditary cause for the cancer in the family that Ms. Bogosian did not inherit and therefore was not identified in her testing.  Therefore, it is important to remain in touch with cancer genetics in the future so that we can continue to offer Ms. Nilan the most up to date genetic testing.   Genetic testing did identify a variant of uncertain significance (VUS) was identified in the CASR gene called c.1211T>G.  At this time, it is unknown if this variant is associated with increased cancer risk or if this is a normal finding, but most variants such as this get  reclassified to being inconsequential. It should not be used to make medical management decisions. With time, we suspect the lab will determine the significance of this variant, if any. If we do learn more about it, we will try to contact Ms. Rudden to discuss it further. However, it is important to stay in touch with [place] periodically and keep the address and phone number up to date.  ADDITIONAL GENETIC TESTING: We discussed with Ms. Salvo that her genetic testing was fairly extensive.  If there are genes identified to increase cancer risk that can be analyzed in the future, we would be happy to discuss and coordinate this testing at that time.    CANCER SCREENING RECOMMENDATIONS: Ms. Abood test result is considered negative (normal).  This means that we have not identified a hereditary cause for her personal and family history of cancer at this time. Most cancers happen by chance and this negative test suggests that her cancer may fall into this category.    While reassuring, this does not definitively rule out a hereditary predisposition to cancer. It is still possible that there could be genetic mutations that are undetectable by current technology. There could be genetic mutations in genes that have not been tested or  identified to increase cancer risk.  Therefore, it is recommended she continue to follow the cancer management and screening guidelines provided by her oncology and primary healthcare provider.   An individual's cancer risk and medical management are not determined by genetic test results alone. Overall cancer risk assessment incorporates additional factors, including personal medical history, family history, and any available genetic information that may result in a personalized plan for cancer prevention and surveillance  RECOMMENDATIONS FOR FAMILY MEMBERS:  Individuals in this family might be at some increased risk of developing cancer, over the general population risk, simply due  to the family history of cancer.  We recommended women in this family have a yearly mammogram beginning at age 39, or 109 years younger than the earliest onset of cancer, an annual clinical breast exam, and perform monthly breast self-exams. Women in this family should also have a gynecological exam as recommended by their primary provider. All family members should have a colonoscopy by age 50.  FOLLOW-UP: Lastly, we discussed with Ms. Petron that cancer genetics is a rapidly advancing field and it is possible that new genetic tests will be appropriate for her and/or her family members in the future. We encouraged her to remain in contact with cancer genetics on an annual basis so we can update her personal and family histories and let her know of advances in cancer genetics that may benefit this family.   Our contact number was provided. Ms. Uzelac questions were answered to her satisfaction, and she knows she is welcome to call us at anytime with additional questions or concerns.   Roma Kayser, MS, [facility] Certified Genetic Counselor [email or web address]

## 2018-12-06 NOTE — Telephone Encounter (Signed)
Revealed negative genetic testing.  Discussed that we do not know why she has breast cancer or why there is cancer in the family. It could be due to a different gene that we are not testing, or maybe our current technology may not be able to pick something up.  It will be important for her to keep in contact with genetics to keep up with whether additional testing may be needed. 

## 2018-12-09 ENCOUNTER — Ambulatory Visit
Admission: RE | Admit: 2018-12-09 | Discharge: 2018-12-09 | Disposition: A | Discharge status: Home / Self Care | Payer: 59 | Source: Ambulatory Visit | Attending: General Surgery | Admitting: General Surgery

## 2018-12-09 ENCOUNTER — Other Ambulatory Visit: Payer: Self-pay | Admitting: General Surgery

## 2018-12-09 ENCOUNTER — Other Ambulatory Visit: Payer: Self-pay

## 2018-12-09 DIAGNOSIS — C50411 Malignant neoplasm of upper-outer quadrant of right female breast: Secondary | ICD-10-CM

## 2018-12-09 DIAGNOSIS — Z170 Estrogen receptor positive status [ER+]: Secondary | ICD-10-CM

## 2018-12-09 DIAGNOSIS — R928 Other abnormal and inconclusive findings on diagnostic imaging of breast: Secondary | ICD-10-CM

## 2018-12-12 ENCOUNTER — Telehealth: Payer: Self-pay | Admitting: *Deleted

## 2018-12-13 NOTE — Telephone Encounter (Signed)
error 

## 2019-01-11 ENCOUNTER — Telehealth: Payer: Self-pay | Admitting: Oncology

## 2019-01-11 NOTE — Telephone Encounter (Signed)
Called patient regarding upcoming Webex appointment, per patient's request appointment has been rescheduled to 06/02. This will still be a Webex visit, patient is notified and an e-mail has been sent.   Message to provider.

## 2019-01-12 ENCOUNTER — Inpatient Hospital Stay: Payer: 59 | Admitting: Oncology

## 2019-01-16 NOTE — Progress Notes (Signed)
Angela Huff  [phone] [phone]    ID: Angela Huff DOB: [birth year]  MR#: [id number]  DTO#:[id number]  Patient Care Team: Lawerance Cruel, MD as PCP - General (Family Medicine) Mauro Kaufmann, RN as Oncology Nurse Navigator Rockwell Germany, RN as Oncology Nurse Navigator Tiandra Swoveland, Virgie Dad, MD as Consulting Physician (Oncology) Stark Klein, MD as Consulting Physician (General Surgery) Paula Compton, MD as Consulting Physician (Obstetrics and Gynecology) Allyn Kenner, MD (Dermatology) OTHER MD:  I connected with Angela Huff on 01/17/19 at  9:00 AM EDT by video enabled telemedicine visit and verified that I am speaking with the correct person using two identifiers.   I discussed the limitations, risks, security and privacy concerns of performing an evaluation and management service by telemedicine and the availability of in-person appointments. I also discussed with the patient that there may be a patient responsible charge related to this service. The patient expressed understanding and agreed to proceed.   Other persons participating in the visit and their role in the encounter: Husband Angela Huff; Angela Huff, Angela Huff   Patients location: home  Providers location: [place]: Ductal carcinoma in situ right breast  CURRENT TREATMENT: Tamoxifen   INTERVAL HISTORY: Angela Huff is seen today for follow up of her ductal carcinoma in situ.  She continues on tamoxifen with good tolerance. She reports a few hot flashes and denies vaginal discharge. She has not had regular periods because of a previous ablation, but she continues to experience spotting. She does not pay for it at this time.  Since her last visit, her genetics testing results came back, which were negative. A variant of uncertain significance was identified within CASR.  She also underwent two additional right breast biopsies on 12/09/2018.  Pathology from the procedure ([id number]) revealed: atypical ductal hyperplasia with calcifications within both samples, located in the lower-inner quadrant and the upper-outer quadrant.  She is scheduled to meet with Dr. Barry Dienes on 01/23/2019, who she has not seen since April.   REVIEW OF SYSTEMS: Angela Huff reports doing okay overall. Her mother passed away very unexpectedly over [place] Day weekend. [name] has not been exercising, but she has instead been using her quarantine time to fix up things around her house that she hasn't been able to do. She and her husband are celebrating their 31st anniversary on 01/25/2019. Their youngest son is graduating high school this month. Her husband works in [employer], so he has worked from home the last 4 years.  The patient denies unusual headaches, visual changes, nausea, vomiting, stiff neck, dizziness, or gait imbalance. There has been no cough, phlegm production, or pleurisy, no chest pain or pressure, and no change in bowel or bladder habits. The patient denies fever, rash, bleeding, unexplained fatigue or unexplained weight loss. A detailed review of systems was otherwise entirely negative.   HISTORY OF CURRENT ILLNESS: Angela Huff had routine screening mammography on 11/01/2018 showing a possible abnormality in the right breast. She underwent unilateral right diagnostic mammography with tomography and right breast ultrasonography at [facility] on 11/14/2018 showing: Breast Density Category C. Grouped punctate and amorphous calcifications are confirmed within the lower inner quadrant of the right breast, at posterior depth, measuring 6 mm extent. Additional loosely grouped punctate and amorphous calcifications are confirmed within the upper-outer quadrant of the right breast and central/retroareolar right breast, measuring 4.5 cm greatest extent and 7 mm extent respectively.Targeted ultrasound is performed, evaluating the upper-outer  quadrant of the  right breast, showing scattered benign cysts, some simple and some complicated with internal debris, largest measuring 7 mm. No suspicious solid or cystic mass is identified by ultrasound. Right axilla was evaluated with ultrasound showing no enlarged or morphologically abnormal lymph nodes.  Accordingly on 11/15/2018 she proceeded to biopsy of the right breast area in question. The pathology from this procedure showed ([id number]): ductal carcinoma in situ, intermediate nuclear grade with calcifications, arising in a complex sclerosing lesion. Prognostic indicators significant for: estrogen receptor, 95% positive and progesterone receptor, 95% positive, both with strong staining intensity.  An additional biopsy of the right breast was performed on the same day showing: 2. Breast, right, needle core biopsy, lower inner quadrant   - Flat epithelial atypia (FEA) with calcifications.   The patient's subsequent history is as detailed below.   PAST MEDICAL HISTORY: Past Medical History:  Diagnosis Date   Family history of breast cancer    Family history of kidney cancer    Family history of stomach cancer   Mild allergic asthma   PAST SURGICAL HISTORY: History reviewed. No pertinent surgical history. Schwannoma removal left facial nerve area 2008 Placement platinum weight left upper lid   FAMILY HISTORY: Family History  Problem Relation Age of Onset   Breast cancer Maternal Grandmother 32       d. 32   Stomach cancer Paternal Grandmother 54   Skin cancer Paternal Grandmother    Skin cancer Father 17       d. 70   Kidney cancer Brother    Heart Problems Paternal Uncle    Drug abuse Brother        d. 78   Breast cancer Maternal Great-grandmother 32       MGF's mother  The patient's father died from slowly but relentlessly invasive basal cell carcinoma at the age of 43.  The patient's mother passed away unexpectedly in 11-Jan-2019 at age 42.  The patient has 1 brother who died  at the age of 27 from a drug overdose, 2 older brothers, and 1 sister, with no history of cancer in the immediate family.  The patient's mother's mother was diagnosed with breast cancer at age 49 and died at age 60.  That grandmother had brothers, no sisters.    GYNECOLOGIC HISTORY:  No LMP recorded. 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  Her husband Ethelle Lyon") works in Interior and spatial designer for [employer].  Their children are [name], 25, who is teaching [place] in [place]; Vineyard, 23, who is home, and his son [name] 17 who is home.   ADVANCED DIRECTIVES: The patient's husband is her healthcare power of attorney   HEALTH MAINTENANCE: Social History   Tobacco Use   Smoking status: Not on file  Substance Use Topics   Alcohol use: Not on file   Drug use: Not on file    Colonoscopy: [facility], 2019  PAP: Up-to-date/[facility]  Bone density: Never  Allergies no known allergies  Current Outpatient Medications  Medication Sig Dispense Refill   albuterol (PROVENTIL HFA;VENTOLIN HFA) 108 (90 Base) MCG/ACT inhaler albuterol sulfate HFA 90 mcg/actuation aerosol inhaler  INHALE 1 TO 2 PUFFS BY MOUTH EVERY  4 HOURS AS NEEDED     clotrimazole-betamethasone (LOTRISONE) cream clotrimazole-betamethasone 1 %-0.05 % topical cream  APPLY TO THE AFFECTED AND SURROUNDING AREAS OF SKIN BY TOPICAL ROUTE 2 TIMES PER DAY IN THE MORNING AND EVENING FOR 2 WEEKS     hydrochlorothiazide (MICROZIDE) 12.5 MG capsule hydrochlorothiazide 12.5 mg capsule     omeprazole (PRILOSEC OTC) 20 MG tablet Take by mouth.     tamoxifen (NOLVADEX) 20 MG tablet Take 1 tablet (20 mg total) by mouth daily for 30 days. 90 tablet 12   No  current facility-administered medications for this visit.      OBJECTIVE: Middle-aged white woman in no acute distress  There were no vitals filed for this visit.   There is no height or weight on file to calculate BMI.   Wt Readings from Last 3 Encounters:  11/28/18 196 lb 8 oz (89.1 kg)      ECOG FS:1 - Symptomatic but completely ambulatory   LAB RESULTS:  CMP  No results found for: NA, K, CL, CO2, GLUCOSE, BUN, CREATININE, CALCIUM, PROT, ALBUMIN, AST, ALT, ALKPHOS, BILITOT, GFRNONAA, GFRAA  No results found for: TOTALPROTELP, ALBUMINELP, A1GS, A2GS, BETS, BETA2SER, GAMS, MSPIKE, SPEI  No results found for: KPAFRELGTCHN, LAMBDASER, KAPLAMBRATIO  Lab Results  Component Value Date   HGB 10.9 (L) 05/27/2007    _0 @  No results found for: LABCA2  No components found for: NATFTD322  No results for input(s): INR in the last 168 hours.  No results found for: LABCA2  No results found for: GUR427  No results found for: CWC376  No results found for: EGB151  No results found for: CA2729  No components found for: HGQUANT  No results found for: CEA1 / No results found for: CEA1   No results found for: AFPTUMOR  No results found for: CHROMOGRNA  No results found for: PSA1  No visits with results within 3 Day(s) from this visit.  Latest known visit with results is:  Hospital Outpatient Visit on 05/27/2007  Component Date Value Ref Range Status   Operator id 05/27/2007 [id number]   Final   Hemoglobin 05/27/2007 10.9*  Final    (this displays the last labs from the last 3 days)  No results found for: TOTALPROTELP, ALBUMINELP, A1GS, A2GS, BETS, BETA2SER, GAMS, MSPIKE, SPEI (this displays SPEP labs)  No results found for: KPAFRELGTCHN, LAMBDASER, KAPLAMBRATIO (kappa/lambda light chains)  No results found for: HGBA, HGBA2QUANT, HGBFQUANT, HGBSQUAN (Hemoglobinopathy evaluation)   No results found for: LDH  No results found for: IRON, TIBC,  IRONPCTSAT (Iron and TIBC)  No results found for: FERRITIN  Urinalysis No results found for: COLORURINE, APPEARANCEUR, LABSPEC, PHURINE, GLUCOSEU, HGBUR, BILIRUBINUR, KETONESUR, PROTEINUR, UROBILINOGEN, NITRITE, LEUKOCYTESUR   STUDIES:  No results found.   ELIGIBLE FOR AVAILABLE RESEARCH PROTOCOL: NO   ASSESSMENT: 53 y.o. [place], [place] woman status post right breast upper outer quadrant biopsy 11/15/2018 showing ductal carcinoma in situ, grade 2, strongly estrogen and progesterone receptor positive  (a) biopsy of a lower inner quadrant area of calcifications 11/15/2018 showed flat epithelial atypia  (b) biopsy of right breast lower inner and upper outer quadrants 12/09/2018 both showed atypical ductal hyperplasia  (1) genetics testing 11/28/2018 through the   Multi-Gene Panel offered by [facility] found no deleterious mutations in AIP, ALK, APC, ATM, AXIN2,BAP1,  BARD1, BLM, BMPR1A, BRCA1, BRCA2, BRIP1, CASR, CDC73, CDH1, CDK4, CDKN1B, CDKN1C, CDKN2A (p14ARF), CDKN2A (p16INK4a), CEBPA, CHEK2, CTNNA1, DICER1, DIS3L2, EGFR (c.2369C>T, p.Thr790Met variant only), EPCAM (Deletion/duplication testing only), FH, FLCN,  GATA2, GPC3, GREM1 (Promoter region deletion/duplication testing only), HOXB13 (c.251G>A, p.Gly84Glu), HRAS, KIT, MAX, MEN1, MET, MITF (c.952G>A, p.Glu318Lys variant only), MLH1, MSH2, MSH3, MSH6, MUTYH, NBN, NF1, NF2, NTHL1, PALB2, PDGFRA, PHOX2B, PMS2, POLD1, POLE, POT1, PRKAR1A, PTCH1, PTEN, RAD50, RAD51C, RAD51D, RB1, RECQL4, RET, RNF43, RUNX1, SDHAF2, SDHA (sequence changes only), SDHB, SDHC, SDHD, SMAD4, SMARCA4, SMARCB1, SMARCE1, STK11, SUFU, TERC, TERT, TMEM127, TP53, TSC1, TSC2, VHL, WRN and WT1.  The report date is December 05, 2018.  (a) CASR c.1211T>G VUS identified.   (2) tamoxifen started 11/28/2018  (3) definitive surgery to follow  (4) adjuvant radiation to follow surgery   PLAN: Angela Huff is tolerating tamoxifen remarkably well and the plan will be to continue that a  total of 5 years.  She is not sure whether or not she is menopausal.  She does have occasional spotting at times.  I suspect she is peri-or premenopausal.  I am going to obtain lab work a couple of weeks before her next visit here to document that one way or the other  She will meet with Dr. Barry Dienes later this month to discuss her surgical options.  Surgery for DCIS and ADH is in transition and I am comfortable with what ever decision she and Dr. Barry Dienes make regarding that.  Since the patient is on tamoxifen there is absolutely no rush and if she wishes to postpone the surgery several months for what ever reason I am comfortable with her doing that.  Once the surgery is scheduled though I would stop tamoxifen 2 weeks preop.  She may restart it a few days afterwards once she is completely ambulatory  She knows to call for any other issue that may develop before the next visit.   Loras Grieshop, Virgie Dad, MD  01/17/19 9:14 AM Medical Oncology and Hematology [facility] [address] [place], [place] [address] Tel. [phone]    Fax. [phone]    I, Angela Huff, am acting as Angela Huff for Dr. Virgie Dad. Khylin Gutridge.  I, Lurline Del MD, have reviewed the above documentation for accuracy and completeness, and I agree with the above.

## 2019-01-17 ENCOUNTER — Inpatient Hospital Stay: Payer: 59 | Attending: Oncology | Admitting: Oncology

## 2019-01-17 ENCOUNTER — Encounter: Payer: Self-pay | Admitting: Oncology

## 2019-01-17 DIAGNOSIS — Z170 Estrogen receptor positive status [ER+]: Secondary | ICD-10-CM | POA: Diagnosis not present

## 2019-01-17 DIAGNOSIS — C50411 Malignant neoplasm of upper-outer quadrant of right female breast: Secondary | ICD-10-CM | POA: Diagnosis not present

## 2019-01-17 MED ORDER — TAMOXIFEN CITRATE 20 MG PO TABS: 20.0000 mg | ORAL_TABLET | Freq: Every day | ORAL | 12 refills | Status: DC

## 2019-01-18 ENCOUNTER — Telehealth: Payer: Self-pay | Admitting: Oncology

## 2019-01-18 NOTE — Telephone Encounter (Signed)
Talk with patient regarding schedule °

## 2019-01-23 ENCOUNTER — Other Ambulatory Visit: Payer: Self-pay | Admitting: General Surgery

## 2019-01-23 DIAGNOSIS — C50411 Malignant neoplasm of upper-outer quadrant of right female breast: Secondary | ICD-10-CM

## 2019-01-23 DIAGNOSIS — Z170 Estrogen receptor positive status [ER+]: Secondary | ICD-10-CM

## 2019-01-27 ENCOUNTER — Telehealth: Payer: Self-pay

## 2019-01-27 NOTE — Telephone Encounter (Signed)
RN spoke with patient and patient's husband.  Pt has been experiencing insomnia since starting the Tamoxifen.    Per Dr. Jana Hakim okay to try OTC Melatonin.  RN educated on supplements not being FDA approved, voiced understanding.    Pt and pt's husband wanted to update that they have seen the surgeon since last office visit and have had changes with plan.  Surgeon is now wanting to do mastectomy and no radiation.  Will update Dr. Jana Hakim on change, and new recommendations patient will be notified.

## 2019-02-01 ENCOUNTER — Other Ambulatory Visit: Payer: Self-pay | Admitting: Oncology

## 2019-02-01 ENCOUNTER — Encounter: Payer: Self-pay | Admitting: Oncology

## 2019-02-01 DIAGNOSIS — C50411 Malignant neoplasm of upper-outer quadrant of right female breast: Secondary | ICD-10-CM

## 2019-02-01 NOTE — Progress Notes (Signed)
I received a note that Angela Huff had decided to have a mastectomy.  She is interested in D IEP reconstruction.  I placed a referral to [facility] so she can discuss it further.  She is having some insomnia possibly related to the tamoxifen.  I suggested she not take the drug for the next 3 nights and if she sleeps better with that then she can cut the dose down to 10 mg daily  We received an [facility] report from her gynecologist.  This was obtained 11/01/2018.  It was 5.4.

## 2019-02-03 ENCOUNTER — Telehealth: Payer: Self-pay | Admitting: *Deleted

## 2019-02-03 NOTE — Telephone Encounter (Signed)
VM left by pt's husband at 240 pm stating per communication with Dr Hendricks Milo scheduler - for reconstructive surgery to be performed - need for referral to be sent to either Dr Danella Deis or Dr Carlynn Herald for general breast surgery consult so reconstruction can be coordinated appropriately.  Return call numbers given for Angela Huff as [phone] and to Angela Huff ( husband ) at [phone].  This RN return call to [name]- obtained identified VM- message left referral will be made per MD for coordination of surgery at [facility].

## 2019-02-03 NOTE — Telephone Encounter (Signed)
See other entry 

## 2019-02-06 ENCOUNTER — Encounter: Payer: Self-pay | Admitting: *Deleted

## 2019-02-06 ENCOUNTER — Encounter: Payer: Self-pay | Admitting: Oncology

## 2019-02-06 ENCOUNTER — Other Ambulatory Visit: Payer: Self-pay | Admitting: Oncology

## 2019-02-06 DIAGNOSIS — Z170 Estrogen receptor positive status [ER+]: Secondary | ICD-10-CM

## 2019-02-06 DIAGNOSIS — C50411 Malignant neoplasm of upper-outer quadrant of right female breast: Secondary | ICD-10-CM

## 2019-02-21 ENCOUNTER — Other Ambulatory Visit: Payer: Self-pay | Admitting: Oncology

## 2019-02-28 ENCOUNTER — Other Ambulatory Visit: Payer: Self-pay | Admitting: Oncology

## 2019-03-16 ENCOUNTER — Telehealth: Payer: Self-pay | Admitting: Oncology

## 2019-03-16 NOTE — Telephone Encounter (Signed)
Scheduled apt per 7/30 sch message - pt is aware of appt date and time

## 2019-04-09 NOTE — Progress Notes (Signed)
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  Prognostic panel was not obtained on the invasive component  A total of 4 lymph nodes were removed, all were uninvolved by tumor cells (0/4).  She did generally well after the 10-hour surgery, but did require slightly longer hospitalization than anticipated, she thinks because she tolerated the pain medicine poorly.  She had a considerable amount of pain which is only now beginning to improve.  She had no "mental fog", no bleeding, and no fever.  A portion of her nipple is compromised she tells me.  She is just beginning to become more active physically.  She is stop tamoxifen close to 3 weeks prior to the surgery.  She has not yet resumed it.  She was tolerating it with no unusual side effects.   REVIEW OF SYSTEMS: Angela Huff drove herself here today, this being the first time she drove since her surgery.  She has not noticed  any swelling of the right upper extremity.  She was very fatigued but that is improving and she thinks she may be able to start a walking program at this point.  She has noted an odor associated with the breast although her husband cannot smell it (I also could not smell it today).  She has significant nausea, which persists.  She has shooting pains in the reconstructed breast, which are a bother but very brief.  She has muscle spasms associated with the abdominal incision which are more of a problem for her.  Aside from these issues a detailed review of systems today was stable   HISTORY OF CURRENT ILLNESS: Angela Huff had routine screening mammography on 11/01/2018 showing a possible abnormality in the right breast. She underwent unilateral right diagnostic mammography with tomography and right breast ultrasonography at [facility] on 11/14/2018 showing: Breast Density Category C. Grouped punctate and amorphous calcifications are confirmed within the lower inner quadrant of the right breast, at posterior depth, measuring 6 mm extent. Additional loosely grouped punctate and amorphous calcifications are confirmed within the upper-outer quadrant of the right breast and central/retroareolar right breast, measuring 4.5 cm greatest extent and 7 mm extent respectively.Targeted ultrasound is performed, evaluating the upper-outer quadrant of the right breast, showing scattered benign cysts, some simple and some complicated with internal debris, largest measuring 7 mm. No suspicious solid or cystic mass is identified by ultrasound. Right axilla was evaluated with ultrasound showing no enlarged or morphologically abnormal lymph nodes.  Accordingly on 11/15/2018 she proceeded to biopsy of the right breast area in question. The pathology from this  procedure showed ([id number]): ductal carcinoma in situ, intermediate nuclear grade with calcifications, arising in a complex sclerosing lesion. Prognostic indicators  significant for: estrogen receptor, 95% positive and progesterone receptor, 95% positive, both with strong staining intensity.  An additional biopsy of the right breast was performed on the same day showing: 2. Breast, right, needle core biopsy, lower inner quadrant   - Flat epithelial atypia (FEA) with calcifications.   The patient's subsequent history is as detailed below.   PAST MEDICAL HISTORY: Past Medical History:  Diagnosis Date  . Family history of breast cancer   . Family history of kidney cancer   . Family history of stomach cancer   Mild allergic asthma   PAST SURGICAL HISTORY: No past surgical history on file. Schwannoma removal left facial nerve area 2008 Placement platinum weight left upper lid   FAMILY HISTORY: Family History  Problem Relation Age of Onset  . Breast cancer Maternal Grandmother 32       d. 37  . Stomach cancer Paternal Grandmother 68  . Skin cancer Paternal Grandmother   . Skin cancer Father 74       d. 63  . Kidney cancer Brother   . Heart Problems Paternal Uncle   . Drug abuse Brother        d. 39  . Breast cancer Maternal Great-grandmother 33       MGF's mother  The patient's father died from slowly but relentlessly invasive basal cell carcinoma at the age of 96.  The patient's mother passed away unexpectedly in 27-Dec-2018 at age 67.  The patient has 1 brother who died at the age of 6 from a drug overdose, 2 older brothers, and 1 sister, with no history of cancer in the immediate family.  The patient's mother's mother was diagnosed with breast cancer at age 41 and died at age 18.  That grandmother had brothers, no sisters.    GYNECOLOGIC HISTORY:  No LMP recorded. 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  Her husband Angela Huff") works in Interior and spatial designer for [employer].  Their children are [name], 25, who is teaching [place] in [place]; [place], 23, who is home, and his son [name] 17 who is home.   ADVANCED DIRECTIVES: The patient's husband is her healthcare power of attorney   HEALTH MAINTENANCE: Social History   Tobacco Use  . Smoking status: Not on file  Substance Use Topics  . Alcohol use: Not on file  . Drug use: Not on file    Colonoscopy: [facility], 2019  PAP: Up-to-date/[facility]  Bone density: Never  No Known Allergies  Current Outpatient Medications  Medication Sig Dispense Refill  . albuterol (PROVENTIL HFA;VENTOLIN HFA) 108 (90 Base) MCG/ACT inhaler albuterol sulfate HFA 90 mcg/actuation aerosol inhaler  INHALE 1 TO 2 PUFFS BY MOUTH EVERY 4 HOURS AS NEEDED    . clotrimazole-betamethasone (LOTRISONE) cream clotrimazole-betamethasone 1 %-0.05 % topical cream  APPLY TO THE AFFECTED AND SURROUNDING AREAS OF SKIN BY TOPICAL ROUTE 2 TIMES PER DAY IN THE MORNING AND EVENING FOR 2 WEEKS    . hydrochlorothiazide (MICROZIDE) 12.5 MG capsule hydrochlorothiazide 12.5 mg capsule    . omeprazole (PRILOSEC OTC) 20 MG tablet  Take by mouth.     No current facility-administered medications for this visit.      OBJECTIVE: Middle-aged white woman who appears stated age  53:   04/10/19 1631  BP: (!) 124/91  Pulse: (!) 111  Resp: 17  Temp: 99.1 F (37.3 C)  SpO2: 99%     Body mass index is 27.25 kg/m.   Wt Readings from Last 3 Encounters:  04/10/19 179 lb 3.2 oz (81.3 kg)  11/28/18 196 lb 8 oz (89.1 kg)      ECOG FS:1 - Symptomatic but completely ambulatory  Sclerae unicteric, EOMs intact Wearing a mask No cervical or supraclavicular adenopathy Lungs no rales or rhonchi Heart regular rate and rhythm Abd soft, nontender, positive bowel sounds MSK no focal spinal tenderness, no right upper extremity lymphedema  Neuro: nonfocal, well oriented, appropriate affect Breasts: The right breast is status post mastectomy with stacked DIEP flap reconstruction.  It is imaged below.  The cosmetic result is good.  The left breast is unremarkable.  Both axillae are benign.     LAB RESULTS:  CMP  No results found for: NA, K, CL, CO2, GLUCOSE, BUN, CREATININE, CALCIUM, PROT, ALBUMIN, AST, ALT, ALKPHOS, BILITOT, GFRNONAA, GFRAA  No results found for: TOTALPROTELP, ALBUMINELP, A1GS, A2GS, BETS, BETA2SER, GAMS, MSPIKE, SPEI  No results found for: KPAFRELGTCHN, LAMBDASER, KAPLAMBRATIO  Lab Results  Component Value Date   HGB 10.9 (L) 05/27/2007    _0 @  No results found for: LABCA2  No components found for: [id number]  No results for input(s): INR in the last 168 hours.  No results found for: LABCA2  No results found for: ZJI967  No results found for: ELF810  No results found for: FBP102  No results found for: CA2729  No components found for: HGQUANT  No results found for: CEA1 / No results found for: CEA1   No results found for: AFPTUMOR  No results found for: CHROMOGRNA  No results found for: PSA1  No visits with results within 3 Day(s) from this visit.  Latest known visit with results is:  Hospital Outpatient Visit on 05/27/2007  Component Date Value Ref Range Status  . Operator id 05/27/2007 [id number]   Final  . Hemoglobin 05/27/2007 10.9*  Final    (this displays the last labs from the last 3 days)  No results found for: TOTALPROTELP, ALBUMINELP, A1GS, A2GS, BETS, BETA2SER, GAMS, MSPIKE, SPEI (this displays SPEP labs)  No results found for: KPAFRELGTCHN, LAMBDASER, KAPLAMBRATIO (kappa/lambda light chains)  No results found for: HGBA, HGBA2QUANT, HGBFQUANT, HGBSQUAN (Hemoglobinopathy evaluation)   No results found for: LDH  No results found for: IRON, TIBC, IRONPCTSAT (Iron and TIBC)  No results found for: FERRITIN  Urinalysis No results found for:  COLORURINE, APPEARANCEUR, LABSPEC, PHURINE, GLUCOSEU, HGBUR, BILIRUBINUR, KETONESUR, PROTEINUR, UROBILINOGEN, NITRITE, LEUKOCYTESUR   STUDIES:  Outside records reviewed with the patient  ELIGIBLE FOR AVAILABLE RESEARCH PROTOCOL: NO   ASSESSMENT: 53 y.o. [place], [place] woman status post right breast upper outer quadrant biopsy 11/15/2018 showing ductal carcinoma in situ, grade 2, strongly estrogen and progesterone receptor positive  (a) biopsy of a lower inner quadrant area of calcifications 11/15/2018 showed flat epithelial atypia  (b) biopsy of right breast lower inner and upper outer quadrants 12/09/2018 both showed atypical ductal hyperplasia  (1) genetics testing 11/28/2018 through the   Multi-Gene Panel offered by [facility] found no deleterious mutations in AIP, ALK, APC, ATM, AXIN2,BAP1,  BARD1, BLM, BMPR1A, BRCA1, BRCA2, BRIP1, CASR, CDC73, CDH1, CDK4, CDKN1B, CDKN1C, CDKN2A (  p14ARF), CDKN2A (p16INK4a), CEBPA, CHEK2, CTNNA1, DICER1, DIS3L2, EGFR (c.2369C>T, p.Thr790Met variant only), EPCAM (Deletion/duplication testing only), FH, FLCN, GATA2, GPC3, GREM1 (Promoter region deletion/duplication testing only), HOXB13 (c.251G>A, p.Gly84Glu), HRAS, KIT, MAX, MEN1, MET, MITF (c.952G>A, p.Glu318Lys variant only), MLH1, MSH2, MSH3, MSH6, MUTYH, NBN, NF1, NF2, NTHL1, PALB2, PDGFRA, PHOX2B, PMS2, POLD1, POLE, POT1, PRKAR1A, PTCH1, PTEN, RAD50, RAD51C, RAD51D, RB1, RECQL4, RET, RNF43, RUNX1, SDHAF2, SDHA (sequence changes only), SDHB, SDHC, SDHD, SMAD4, SMARCA4, SMARCB1, SMARCE1, STK11, SUFU, TERC, TERT, TMEM127, TP53, TSC1, TSC2, VHL, WRN and WT1.  The report date is December 05, 2018.  (a) CASR c.1211T>G VUS identified.   (2) tamoxifen started 11/28/2018, stopped perioperatively, to be resumed 04/18/2019  (3) status post right mastectomy and sentinel lymph node sampling 03/07/2019 for a pT1a pN0, stage IA invasive mucinous carcinoma, grade 2 with negative margins, prognostic panel not obtained  (a) status  post immediate stacked DIEP reconstruction   (4) no indication for adjuvant radiation    PLAN: Yarixa is having a generally uneventful postoperative course, with some pain, fatigue and nausea as the chief issues at this point.  We discussed using naproxen 220 mg plus Tylenol 500 mg up to 3 times a day as needed.  She has ondansetron for nausea and I have written for low-dose prochlorperazine as well in case the Zofran is not sufficient.  As far as the fatigue is concerned I suggested she start a walking program and increase as tolerated until she gets to 45 minutes 5 times a week.  We discussed the fact that there was an invasive component to the mostly noninvasive tumor.  We do not have the prognostic panel from the invasive component and technically we do not need 1 since in general we never used chemotherapy for tumors less than 5 mm and even if the tumor were HER-2 positive we would not proceed to trastuzumab.  On the other hand I always prefer to know and it would be easy enough to obtain the prognostic panel in this tumor.  Particularly if it were triple negative it would be of more concern to me.  She understands her risk of developing breast cancer in the other breast is not high, about 1/2 %/year.  As far as the noninvasive breast cancer the cure rate with mastectomy approaches 100%.  Accordingly at this point the plan is to resume tamoxifen, which we will continue to a total of 5 years.  She will have mammography at [facility] with the next one scheduled for August of next year.  Accordingly I will see her February 2021 and yearly thereafter.  She knows to call for any other issue that may develop before the next visit. Magrinat, Virgie Dad, MD  04/10/19 5:48 PM Medical Oncology and Hematology [facility] [address] [place], [place] [address] Tel. [phone]    Fax. [phone]    I, Wilburn Mylar, am acting as scribe for Dr. Virgie Dad. Magrinat.  I, Lurline Del  MD, have reviewed the above documentation for accuracy and completeness, and I agree with the above.

## 2019-04-10 ENCOUNTER — Inpatient Hospital Stay: Payer: 59 | Attending: Oncology | Admitting: Oncology

## 2019-04-10 ENCOUNTER — Other Ambulatory Visit: Payer: Self-pay

## 2019-04-10 VITALS — BP 124/91 | HR 111 | Temp 99.1°F | Resp 17 | Ht 68.0 in | Wt 179.2 lb

## 2019-04-10 DIAGNOSIS — Z800 Family history of malignant neoplasm of digestive organs: Secondary | ICD-10-CM | POA: Insufficient documentation

## 2019-04-10 DIAGNOSIS — Z79810 Long term (current) use of selective estrogen receptor modulators (SERMs): Secondary | ICD-10-CM | POA: Insufficient documentation

## 2019-04-10 DIAGNOSIS — Z8051 Family history of malignant neoplasm of kidney: Secondary | ICD-10-CM | POA: Insufficient documentation

## 2019-04-10 DIAGNOSIS — R5383 Other fatigue: Secondary | ICD-10-CM | POA: Insufficient documentation

## 2019-04-10 DIAGNOSIS — R110 Nausea: Secondary | ICD-10-CM | POA: Insufficient documentation

## 2019-04-10 DIAGNOSIS — Z170 Estrogen receptor positive status [ER+]: Secondary | ICD-10-CM

## 2019-04-10 DIAGNOSIS — D0511 Intraductal carcinoma in situ of right breast: Secondary | ICD-10-CM | POA: Insufficient documentation

## 2019-04-10 DIAGNOSIS — C50411 Malignant neoplasm of upper-outer quadrant of right female breast: Secondary | ICD-10-CM | POA: Diagnosis not present

## 2019-04-10 DIAGNOSIS — Z9011 Acquired absence of right breast and nipple: Secondary | ICD-10-CM | POA: Diagnosis not present

## 2019-04-10 DIAGNOSIS — Z803 Family history of malignant neoplasm of breast: Secondary | ICD-10-CM | POA: Diagnosis not present

## 2019-04-10 DIAGNOSIS — Z79899 Other long term (current) drug therapy: Secondary | ICD-10-CM | POA: Insufficient documentation

## 2019-04-10 MED ORDER — PROCHLORPERAZINE MALEATE 5 MG PO TABS: 10.0000 mg | ORAL_TABLET | Freq: Three times a day (TID) | ORAL | 1 refills | Status: AC | PRN

## 2019-04-10 MED ORDER — TAMOXIFEN CITRATE 20 MG PO TABS: 20.0000 mg | ORAL_TABLET | Freq: Every day | ORAL | 12 refills | Status: AC

## 2019-04-11 ENCOUNTER — Telehealth: Payer: Self-pay | Admitting: Oncology

## 2019-04-11 ENCOUNTER — Encounter: Payer: Self-pay | Admitting: *Deleted

## 2019-04-11 NOTE — Telephone Encounter (Signed)
I left a message regarding schedule  

## 2019-05-18 HISTORY — PX: BREAST RECONSTRUCTION: SHX9

## 2019-07-04 ENCOUNTER — Telehealth: Payer: Self-pay | Admitting: Oncology

## 2019-07-04 NOTE — Telephone Encounter (Signed)
Returned patient's phone call regarding cancelling 11/18 and 12/02 appointments, per patient's request appointment has been cancelled.

## 2019-07-05 ENCOUNTER — Inpatient Hospital Stay: Payer: 59

## 2019-07-19 ENCOUNTER — Ambulatory Visit: Payer: 59 | Admitting: Oncology

## 2019-08-15 ENCOUNTER — Encounter: Payer: Self-pay | Admitting: *Deleted

## 2019-10-01 NOTE — Progress Notes (Signed)
[facility]  [phone]) [phone] [phone]    ID: Harriet Masson DOB: [birth year]  MR#: [id number]  WHQ#:[id number]  Patient Care Team: Lawerance Cruel, MD as PCP - General (Family Medicine) Mauro Kaufmann, RN as Oncology Nurse Navigator Rockwell Germany, RN as Oncology Nurse Navigator Alaijah Gibler, Virgie Dad, MD as Consulting Physician (Oncology) Stark Klein, MD as Consulting Physician (General Surgery) Paula Compton, MD as Consulting Physician (Obstetrics and Gynecology) Allyn Kenner, MD (Dermatology) Sheliah Hatch, MD as Referring Physician (Surgical Oncology) Jacinto Reap, MD (Plastic Surgery) Kimmick, Linus Mako, MD as Referring Physician (Oncology) OTHER MD:   CHIEF COMPLAINT: 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  Since her last visit here she had further reconstruction at [facility]  She did "better" with this 1, with some discomfort but no other major concern.  She is generally satisfied although her right breast is still somewhat larger than the left she, she says, and she is considering some nipple tattooing.  She resumed tamoxifen in September.  She tolerates this well.  She has a mild vaginal discharge which is not itchy or smelly and does not really bother her.  Since her last visit, she underwent revision of the right reconstructed breast on 06/15/2019 under Dr. Abelino Derrick.  She has been scheduled for repeat mammography in August at [facility].   REVIEW OF SYSTEMS: Angela Huff walks for exercise, 1 to 23mles when she does walk.  Of course the weather recently has been quite awful.  She has a little "white spot" in her right areola she wanted me to Huff at.  She no longer has periods, as she had an ablative procedure in the past, but she tells me her labs through  Dr. RMarvel Planstill show her to be premenopausal and she occasionally gets menstrual-like cramps.  Her husband works from home and her son at [employer] has most of his classes virtually.  [place] next 2 weeks.  She has been tested for the virus several times and has been negative.  Aside from these issues a detailed review of systems today was stable   HISTORY OF CURRENT ILLNESS: From the original intake note:  Angela MENCERhad routine screening mammography on 11/01/2018 showing a possible abnormality in the right breast. She underwent unilateral right diagnostic mammography with tomography [facility] showing: Breast Density Category C. Grouped punctate and amorphous calcifications are confirmed within the lower inner quadrant of the right breast, at posterior depth, measuring 6 mm extent. Additional loosely grouped punctate and amorphous calcifications are confirmed within the upper-outer quadrant of the right breast and central/retroareolar right breast, measuring 4.5 cm greatest extent and 7 mm extent respectively.Targeted ultrasound is performed, evaluating the upper-outer quadrant of the right breast, showing scattered benign cysts, some simple and some complicated with internal debris, largest measuring 7 mm. No suspicious solid or cystic mass is identified by ultrasound. Right axilla was evaluated with ultrasound showing no enlarged or morphologically abnormal lymph nodes.  Accordingly on 11/15/2018 she proceeded to biopsy of the right breast area in question. The pathology from this procedure showed ([id number]): ductal carcinoma in situ, intermediate nuclear grade with calcifications, arising in a complex sclerosing lesion. Prognostic indicators significant for: estrogen receptor, 95% positive and  progesterone receptor, 95% positive, both with strong staining intensity.  An additional biopsy  of the right breast was performed on the same day showing: 2. Breast, right, needle core biopsy, lower inner quadrant   - Flat epithelial atypia (FEA) with calcifications.   The patient's subsequent history is as detailed below.   PAST MEDICAL HISTORY: Past Medical History:  Diagnosis Date  . Family history of breast cancer   . Family history of kidney cancer   . Family history of stomach cancer   Mild allergic asthma   PAST SURGICAL HISTORY: No past surgical history on file. Schwannoma removal left facial nerve area 2008 Placement platinum weight left upper lid   FAMILY HISTORY: Family History  Problem Relation Age of Onset  . Breast cancer Maternal Grandmother 32       d. 68  . Stomach cancer Paternal Grandmother 68  . Skin cancer Paternal Grandmother   . Skin cancer Father 65       d. 24  . Kidney cancer Brother   . Heart Problems Paternal Uncle   . Drug abuse Brother        d. 57  . Breast cancer Maternal Great-grandmother 28       MGF's mother  The patient's father died from slowly but relentlessly invasive basal cell carcinoma at the age of 40.  The patient's mother passed away unexpectedly in 2019/01/10 at age 46.  The patient has 1 brother who died at the age of 16 from a drug overdose, 2 older brothers, and 1 sister, with no history of cancer in the immediate family.  The patient's mother's mother was diagnosed with breast cancer at age 38 and died at age 41.  That grandmother had brothers, no sisters.    GYNECOLOGIC HISTORY:  No LMP recorded. 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  Her husband Ethelle Lyon") works in Interior and spatial designer for [employer].  Their children are Angela Huff,  25, who is teaching [place] in [place]; Angela Huff, 23, who is home, and his son Angela Dust 17 who is home.   ADVANCED DIRECTIVES: The patient's husband is her healthcare power of attorney   HEALTH MAINTENANCE: Social History   Tobacco Use  . Smoking status: Not on file  Substance Use Topics  . Alcohol use: Not on file  . Drug use: Not on file    Colonoscopy: [facility], 2019  PAP: Up-to-date/[facility]  Bone density: Never  No Known Allergies  Current Outpatient Medications  Medication Sig Dispense Refill  . albuterol (PROVENTIL HFA;VENTOLIN HFA) 108 (90 Base) MCG/ACT inhaler albuterol sulfate HFA 90 mcg/actuation aerosol inhaler  INHALE 1 TO 2 PUFFS BY MOUTH EVERY 4 HOURS AS NEEDED    . clotrimazole-betamethasone (LOTRISONE) cream clotrimazole-betamethasone 1 %-0.05 % topical cream  APPLY TO THE AFFECTED AND SURROUNDING AREAS OF SKIN BY TOPICAL ROUTE 2 TIMES PER DAY IN THE MORNING AND EVENING FOR 2 WEEKS    . hydrochlorothiazide (MICROZIDE) 12.5 MG capsule hydrochlorothiazide 12.5 mg capsule    . omeprazole (PRILOSEC OTC) 20 MG tablet Take by mouth.    . prochlorperazine (COMPAZINE) 5 MG tablet Take 2 tablets (10 mg total) by mouth every 8 (eight) hours as needed for  nausea or vomiting. 60 tablet 1   No current facility-administered medications for this visit.     OBJECTIVE: Middle-aged white woman in no acute distress  Vitals:   10/02/19 0906  BP: (!) 129/97  Pulse: 96  Resp: 18  Temp: 98.2 F (36.8 C)  SpO2: 99%     Body mass index is 24.78 kg/m.   Wt Readings from Last 3 Encounters:  10/02/19 163 lb (73.9 kg)  04/10/19 179 lb 3.2 oz (81.3 kg)  11/28/18 196 lb 8 oz (89.1 kg)      ECOG FS:1 - Symptomatic but completely ambulatory  Sclerae unicteric, EOMs intact Wearing a mask No cervical or supraclavicular adenopathy Lungs no rales or rhonchi Heart regular rate and rhythm Abd soft, nontender, positive bowel sounds MSK no focal spinal tenderness, no upper extremity  lymphedema Neuro: nonfocal, well oriented, appropriate affect Breasts: The right breast is status post mastectomy with DIEP reconstruction.  The cosmetic result is excellent.  The breast has a very normal-appearing contour.  The little white spot in the nipple is a slight gland production which is crusted over.  It is entirely benign.  There is some asymmetry in that the right breast is perhaps 15% larger than the left.  The left breast itself is unremarkable.  Both axillae are benign.   LAB RESULTS:  CMP     Component Value Date/Time   NA 141 10/02/2019 0847   K 3.3 (L) 10/02/2019 0847   CL 103 10/02/2019 0847   CO2 28 10/02/2019 0847   GLUCOSE 99 10/02/2019 0847   BUN 10 10/02/2019 0847   CREATININE 0.80 10/02/2019 0847   CALCIUM 9.2 10/02/2019 0847   PROT 7.1 10/02/2019 0847   ALBUMIN 3.7 10/02/2019 0847   AST 12 (L) 10/02/2019 0847   ALT 11 10/02/2019 0847   ALKPHOS 67 10/02/2019 0847   BILITOT 0.3 10/02/2019 0847   GFRNONAA >60 10/02/2019 0847   GFRAA >60 10/02/2019 0847    No results found for: TOTALPROTELP, ALBUMINELP, A1GS, A2GS, BETS, BETA2SER, GAMS, MSPIKE, SPEI  No results found for: KPAFRELGTCHN, LAMBDASER, [facility]  Lab Results  Component Value Date   WBC 9.4 10/02/2019   NEUTROABS 4.7 10/02/2019   HGB 14.4 10/02/2019   HCT 42.5 10/02/2019   MCV 88.0 10/02/2019   PLT 325 10/02/2019   No results found for: LABCA2  No components found for: [id number]  No results for input(s): INR in the last 168 hours.  No results found for: LABCA2  No results found for: FWY637  No results found for: CHY850  No results found for: YDX412  No results found for: CA2729  No components found for: HGQUANT  No results found for: CEA1 / No results found for: CEA1   No results found for: AFPTUMOR  No results found for: CHROMOGRNA  No results found for: HGBA, HGBA2QUANT, HGBFQUANT, HGBSQUAN (Hemoglobinopathy evaluation)   No results found for: LDH  No results  found for: IRON, TIBC, IRONPCTSAT (Iron and TIBC)  No results found for: FERRITIN  Urinalysis No results found for: COLORURINE, APPEARANCEUR, LABSPEC, PHURINE, GLUCOSEU, HGBUR, BILIRUBINUR, KETONESUR, PROTEINUR, UROBILINOGEN, NITRITE, LEUKOCYTESUR   STUDIES:  No results found.   ELIGIBLE FOR AVAILABLE RESEARCH PROTOCOL: NO   ASSESSMENT: 54 y.o. [place], [place] woman status post right breast upper outer quadrant biopsy 11/15/2018 showing ductal carcinoma in situ, grade 2, strongly estrogen and progesterone receptor positive  (a) biopsy of a lower inner quadrant area of calcifications 11/15/2018 showed flat epithelial atypia  (b) biopsy  of right breast lower inner and upper outer quadrants 12/09/2018 both showed atypical ductal hyperplasia  (1) genetics testing 11/28/2018 through the   Multi-Gene Panel offered by [facility] found no deleterious mutations in AIP, ALK, APC, ATM, AXIN2,BAP1,  BARD1, BLM, BMPR1A, BRCA1, BRCA2, BRIP1, CASR, CDC73, CDH1, CDK4, CDKN1B, CDKN1C, CDKN2A (p14ARF), CDKN2A (p16INK4a), CEBPA, CHEK2, CTNNA1, DICER1, DIS3L2, EGFR (c.2369C>T, p.Thr790Met variant only), EPCAM (Deletion/duplication testing only), FH, FLCN, GATA2, GPC3, GREM1 (Promoter region deletion/duplication testing only), HOXB13 (c.251G>A, p.Gly84Glu), HRAS, KIT, MAX, MEN1, MET, MITF (c.952G>A, p.Glu318Lys variant only), MLH1, MSH2, MSH3, MSH6, MUTYH, NBN, NF1, NF2, NTHL1, PALB2, PDGFRA, PHOX2B, PMS2, POLD1, POLE, POT1, PRKAR1A, PTCH1, PTEN, RAD50, RAD51C, RAD51D, RB1, RECQL4, RET, RNF43, RUNX1, SDHAF2, SDHA (sequence changes only), SDHB, SDHC, SDHD, SMAD4, SMARCA4, SMARCB1, SMARCE1, STK11, SUFU, TERC, TERT, TMEM127, TP53, TSC1, TSC2, VHL, WRN and WT1.  The report date is December 05, 2018.  (a) CASR c.1211T>G VUS identified.   (2) tamoxifen started 11/28/2018, stopped perioperatively, resumed 04/18/2019  (3) status post right mastectomy and sentinel lymph node sampling 03/07/2019 for a pT1a pN0, stage IA invasive  mucinous carcinoma, grade 2 with negative margins, prognostic panel not obtained  (a) status post immediate stacked DIEP reconstruction   (4) no indication for adjuvant radiation    PLAN: Lory is continuing to work to perfect her reconstruction but already I think her results are excellent.  There are no restrictions on her activities and we discussed the importance of walking or other exercise several times a week, hopefully 30-45 minutes at a time.  She is tolerating tamoxifen well.  She understands that she is taking this more for prophylaxis than treatment.  She had a second opinion at [facility] with Dr. Rip Harbour and that also was very reassuring to her.  The plan then is to continue tamoxifen for a total of 5 years  Since she has her mammogram at [facility] with a visit in August she will see me again in February of next year  Incidentally her husband has a new diagnosis and is undergoing surgery later this month.  I suggested if he wanted to just take a walk and discuss that informally at some point I would be available  Total encounter time 30 minutes.*  Kayden Amend, Virgie Dad, MD  10/02/19 9:34 AM Medical Oncology and Hematology [facility] [place], [place] [address] Tel. [phone]    Fax. [phone]    I, Wilburn Mylar, am acting as scribe for Dr. Virgie Dad. Jasiel Apachito.  I, Lurline Del MD, have reviewed the above documentation for accuracy and completeness, and I agree with the above.   *Total Encounter Time as defined by the Centers for Medicare and Medicaid Services includes, in addition to the face-to-face time of a patient visit (documented in the note above) non-face-to-face time: obtaining and reviewing outside history, ordering and reviewing medications, tests or procedures, care coordination (communications with other health care professionals or caregivers) and documentation in the medical record.

## 2019-10-02 ENCOUNTER — Other Ambulatory Visit: Payer: Self-pay

## 2019-10-02 ENCOUNTER — Inpatient Hospital Stay: Payer: Managed Care, Other (non HMO) | Attending: Oncology | Admitting: Oncology

## 2019-10-02 ENCOUNTER — Inpatient Hospital Stay: Payer: Managed Care, Other (non HMO)

## 2019-10-02 ENCOUNTER — Telehealth: Payer: Self-pay | Admitting: Oncology

## 2019-10-02 VITALS — BP 129/97 | HR 96 | Temp 98.2°F | Resp 18 | Ht 68.0 in | Wt 163.0 lb

## 2019-10-02 DIAGNOSIS — Z803 Family history of malignant neoplasm of breast: Secondary | ICD-10-CM | POA: Diagnosis not present

## 2019-10-02 DIAGNOSIS — Z170 Estrogen receptor positive status [ER+]: Secondary | ICD-10-CM

## 2019-10-02 DIAGNOSIS — Z8051 Family history of malignant neoplasm of kidney: Secondary | ICD-10-CM | POA: Diagnosis not present

## 2019-10-02 DIAGNOSIS — Z79899 Other long term (current) drug therapy: Secondary | ICD-10-CM | POA: Diagnosis not present

## 2019-10-02 DIAGNOSIS — Z800 Family history of malignant neoplasm of digestive organs: Secondary | ICD-10-CM | POA: Diagnosis not present

## 2019-10-02 DIAGNOSIS — Z9011 Acquired absence of right breast and nipple: Secondary | ICD-10-CM | POA: Diagnosis not present

## 2019-10-02 DIAGNOSIS — C50411 Malignant neoplasm of upper-outer quadrant of right female breast: Secondary | ICD-10-CM

## 2019-10-02 DIAGNOSIS — Z79810 Long term (current) use of selective estrogen receptor modulators (SERMs): Secondary | ICD-10-CM | POA: Insufficient documentation

## 2019-10-02 DIAGNOSIS — D0511 Intraductal carcinoma in situ of right breast: Secondary | ICD-10-CM | POA: Diagnosis not present

## 2019-10-02 LAB — CBC WITH DIFFERENTIAL/PLATELET
Abs Immature Granulocytes: 0.03 10*3/uL (ref 0.00–0.07)
Basophils Absolute: 0.1 10*3/uL (ref 0.0–0.1)
Basophils Relative: 1 %
Eosinophils Absolute: 0.2 10*3/uL (ref 0.0–0.5)
Eosinophils Relative: 2 %
HCT: 42.5 % (ref 36.0–46.0)
Hemoglobin: 14.4 g/dL (ref 12.0–15.0)
Immature Granulocytes: 0 %
Lymphocytes Relative: 38 %
Lymphs Abs: 3.6 10*3/uL (ref 0.7–4.0)
MCH: 29.8 pg (ref 26.0–34.0)
MCHC: 33.9 g/dL (ref 30.0–36.0)
MCV: 88 fL (ref 80.0–100.0)
Monocytes Absolute: 0.8 10*3/uL (ref 0.1–1.0)
Monocytes Relative: 9 %
Neutro Abs: 4.7 10*3/uL (ref 1.7–7.7)
Neutrophils Relative %: 50 %
Platelets: 325 10*3/uL (ref 150–400)
RBC: 4.83 MIL/uL (ref 3.87–5.11)
RDW: 11.5 % (ref 11.5–15.5)
WBC: 9.4 10*3/uL (ref 4.0–10.5)
nRBC: 0 % (ref 0.0–0.2)

## 2019-10-02 LAB — COMPREHENSIVE METABOLIC PANEL
ALT: 11 U/L (ref 0–44)
AST: 12 U/L — ABNORMAL LOW (ref 15–41)
Albumin: 3.7 g/dL (ref 3.5–5.0)
Alkaline Phosphatase: 67 U/L (ref 38–126)
Anion gap: 10 (ref 5–15)
BUN: 10 mg/dL (ref 6–20)
CO2: 28 mmol/L (ref 22–32)
Calcium: 9.2 mg/dL (ref 8.9–10.3)
Chloride: 103 mmol/L (ref 98–111)
Creatinine, Ser: 0.8 mg/dL (ref 0.44–1.00)
GFR calc Af Amer: >60 mL/min (ref >60)
GFR calc non Af Amer: >60 mL/min (ref >60)
Glucose, Bld: 99 mg/dL (ref 70–99)
Potassium: 3.3 mmol/L — ABNORMAL LOW (ref 3.5–5.1)
Sodium: 141 mmol/L (ref 135–145)
Total Bilirubin: 0.3 mg/dL (ref 0.3–1.2)
Total Protein: 7.1 g/dL (ref 6.5–8.1)

## 2019-10-02 NOTE — Telephone Encounter (Signed)
I left a message regarding 2022

## 2019-10-03 LAB — FOLLICLE STIMULATING HORMONE: FSH: 13.6 m[IU]/mL

## 2019-10-10 LAB — ESTRADIOL, ULTRA SENS: Estradiol, Sensitive: 609.8 pg/mL

## 2019-11-05 IMAGING — MG MM BREAST LOCALIZATION CLIP
4 series · 4 of 12 positions shown · non-contrast
Comparison: Previous exam(s).

CLINICAL DATA: Status post stereotactic biopsies for 2 sites of
calcifications within the RIGHT breast.

EXAM:
DIAGNOSTIC RIGHT MAMMOGRAM POST STEREOTACTIC BIOPSY x2

[R ML synth-2D]
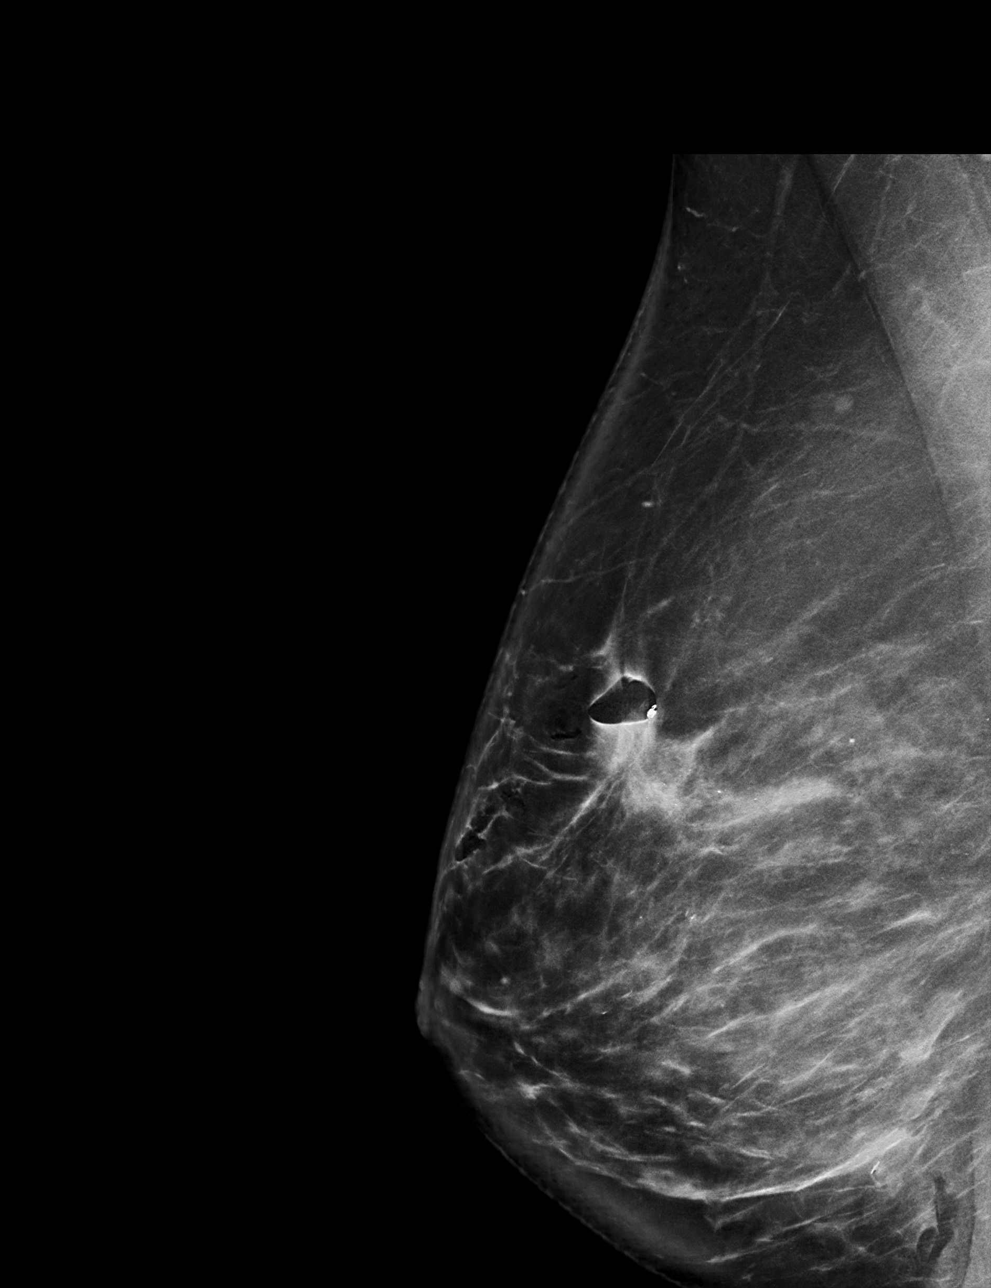

[R XCCM synth-2D]
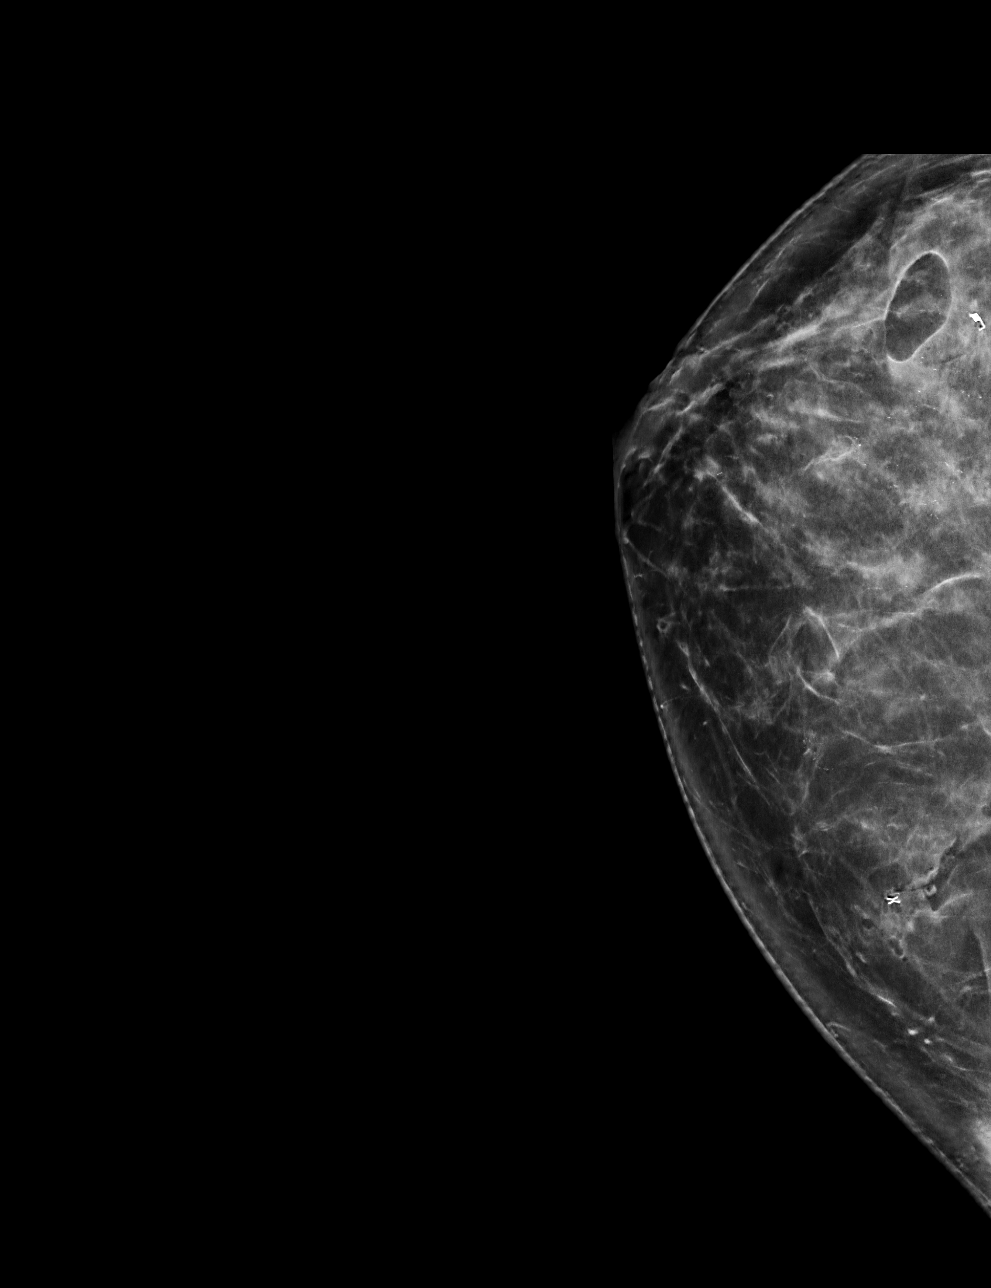

[R ML tomo · tomo slice 51/102.0]
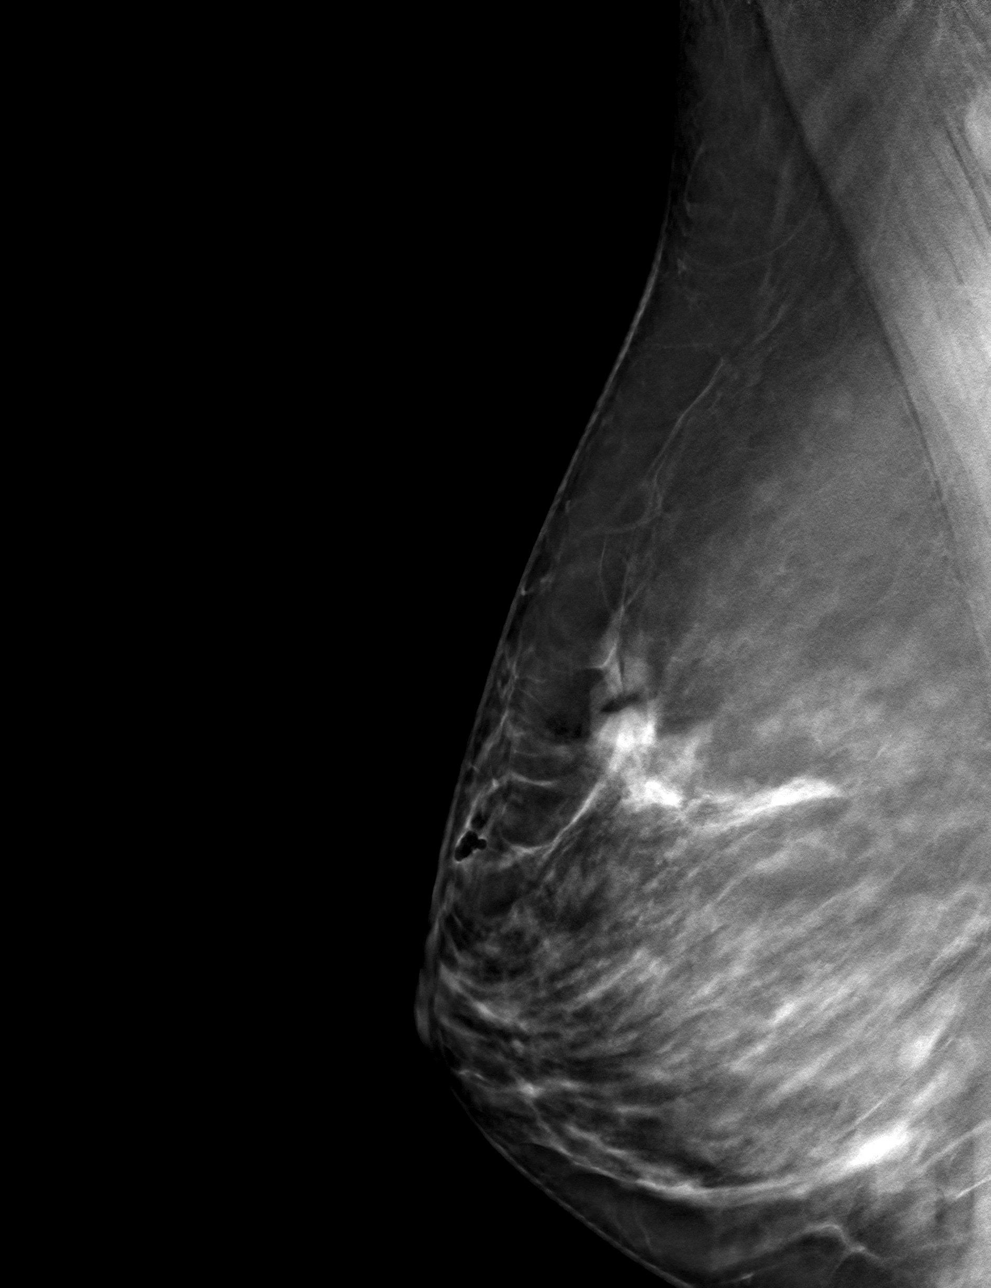

[R XCCM BREAST TOMOSYNTHESIS IMAGE tomo · tomo slice 43/85.0]
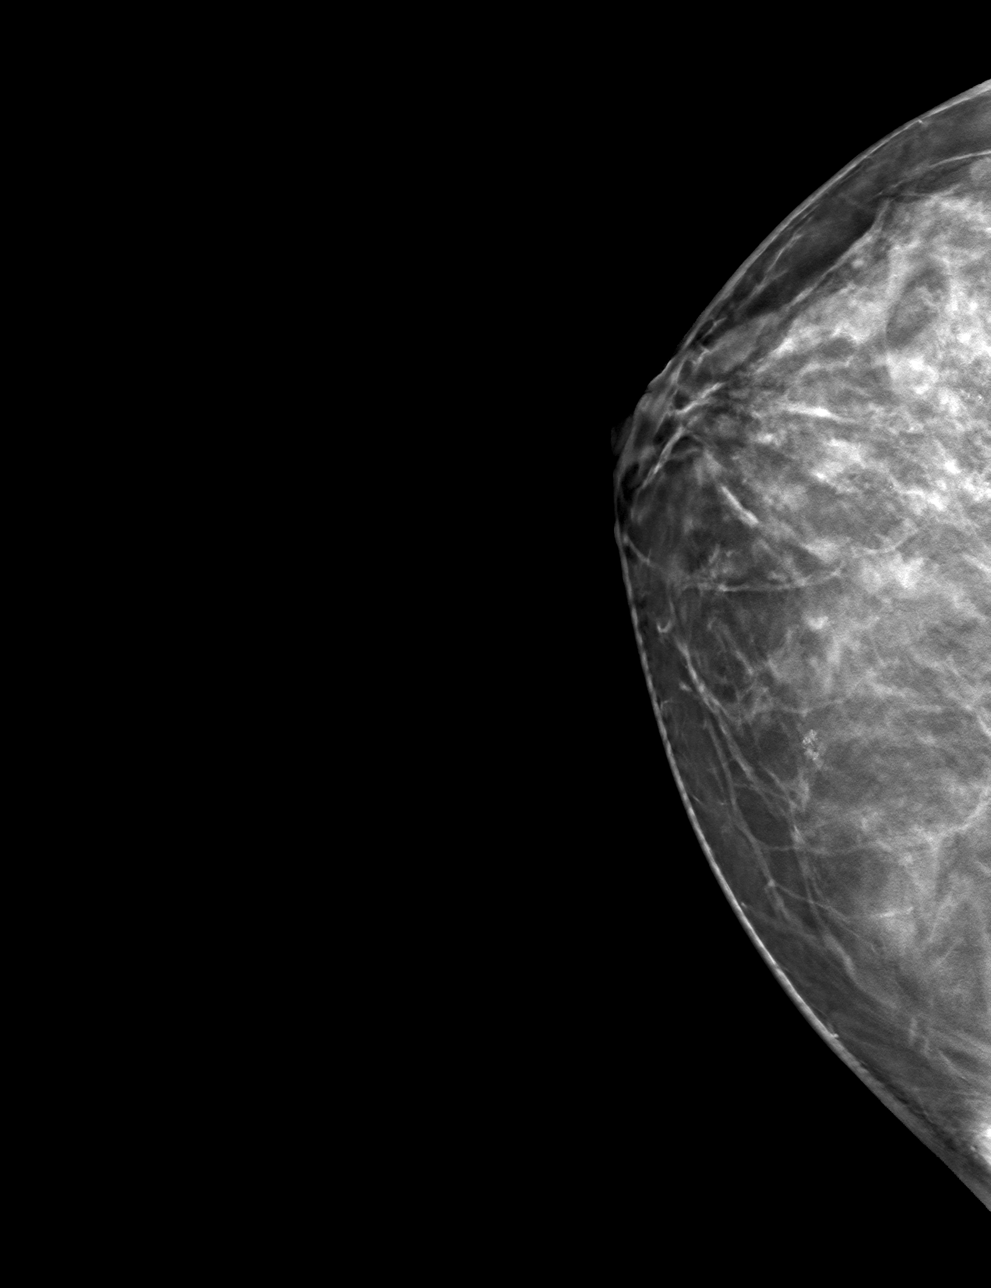

[4 of 12 positions shown; findings below may reference images not displayed]

FINDINGS: Mammographic images were obtained following stereotactic guided
biopsy of 2 sites of calcifications within the RIGHT breast. Coil
shaped clip is appropriately positioned within the upper-outer
quadrant. X shaped clip is appropriately positioned within the lower
inner quadrant.
IMPRESSION: 1. Coil shaped clip is appropriately positioned within the
upper-outer quadrant of the RIGHT breast.
2. X shaped clip is appropriately positioned within the lower inner
quadrant of the RIGHT breast.

Final Assessment: Post Procedure Mammograms for Marker Placement

## 2019-11-05 IMAGING — MG MM BREAST BX W/ LOC DEV EA AD LESION IMG BX SPEC STEREO GUIDE*R*
6 series · 7 of 18 positions shown · non-contrast
Comparison: Previous exams.
COMPARISON: Previous exams.

Addendum:
CLINICAL DATA: Patient with 2 groups of calcifications within the
RIGHT breast presents today for stereotactic biopsies.

EXAM:
RIGHT BREAST STEREOTACTIC CORE NEEDLE BIOPSY x2

[R ML (1 of 3)]
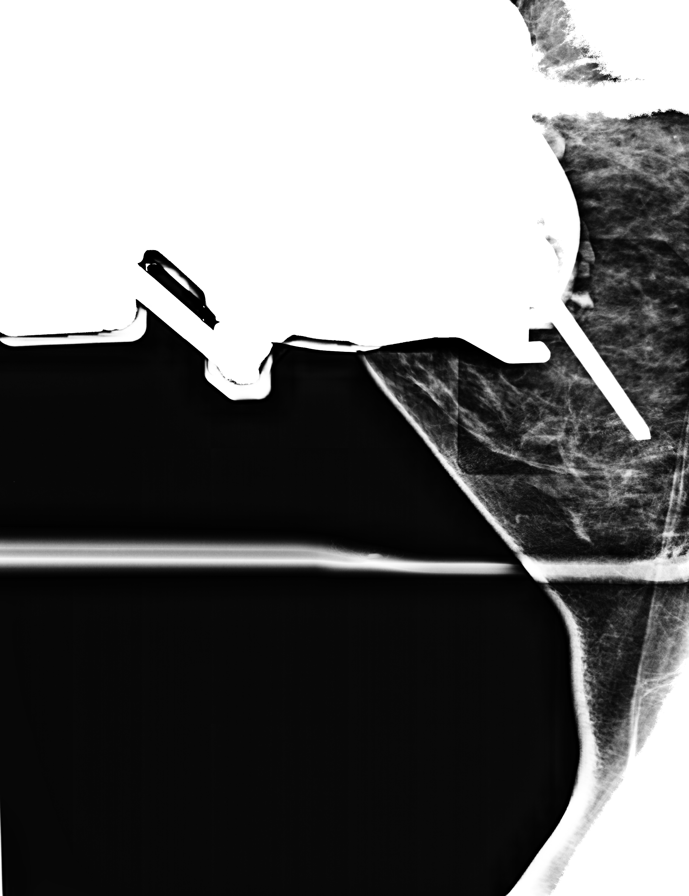

[R ML (2 of 3)]
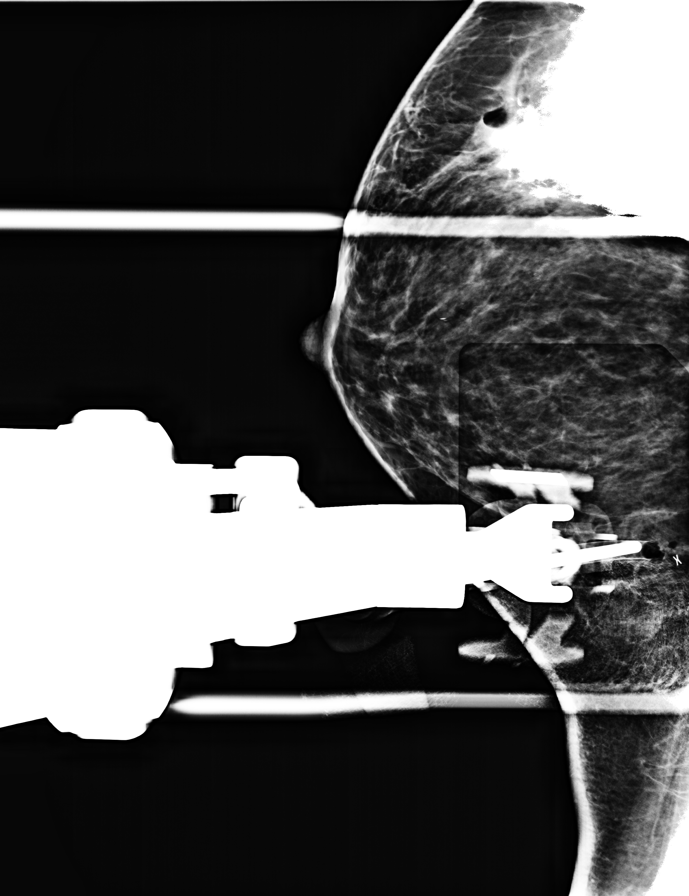

[R ML (3 of 3)]
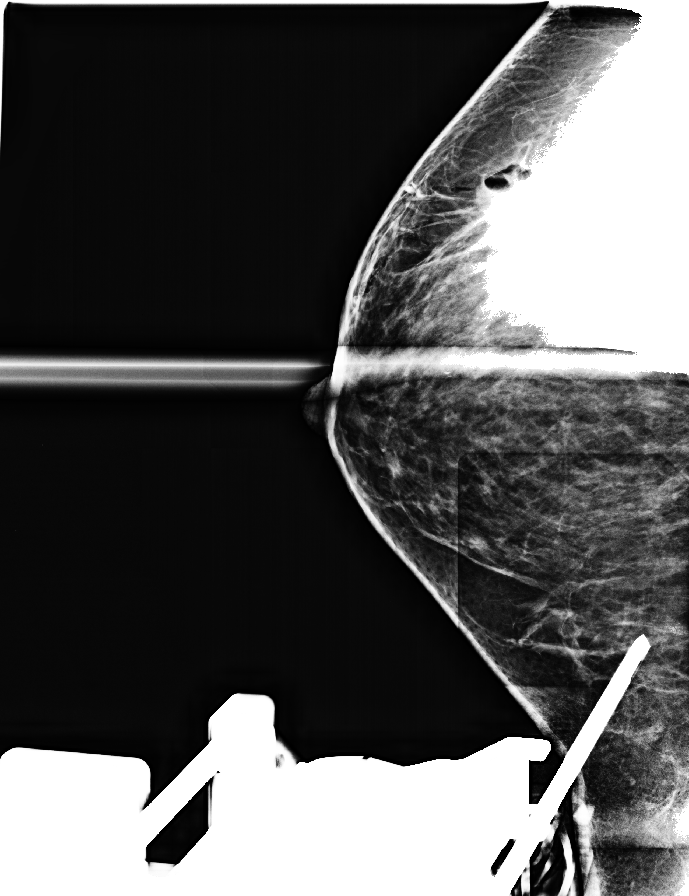

[R ML tomo · 2 of 72 frames shown (1 of 3)]
[frame 24/72]
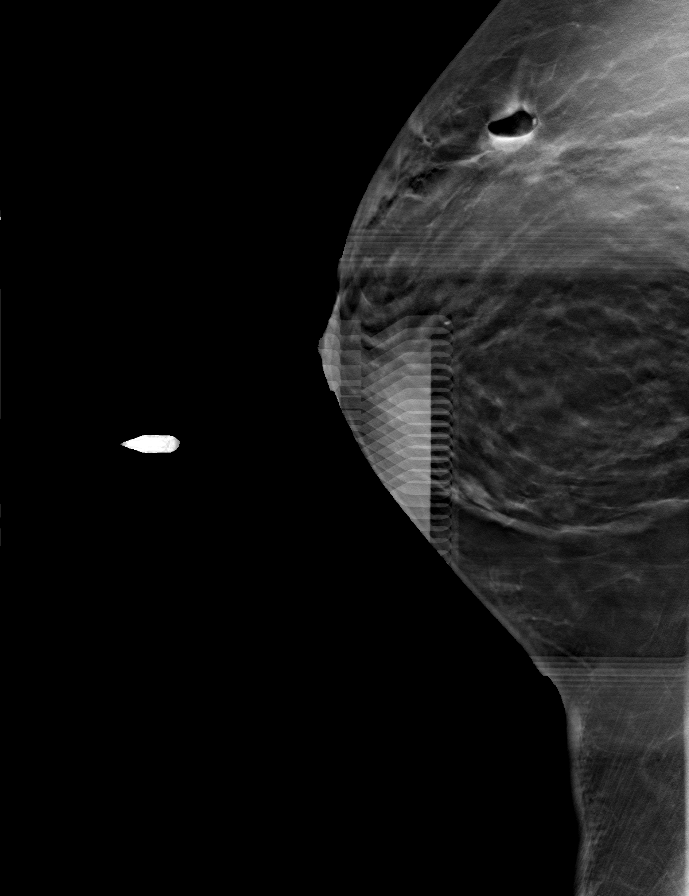
[frame 37/72]
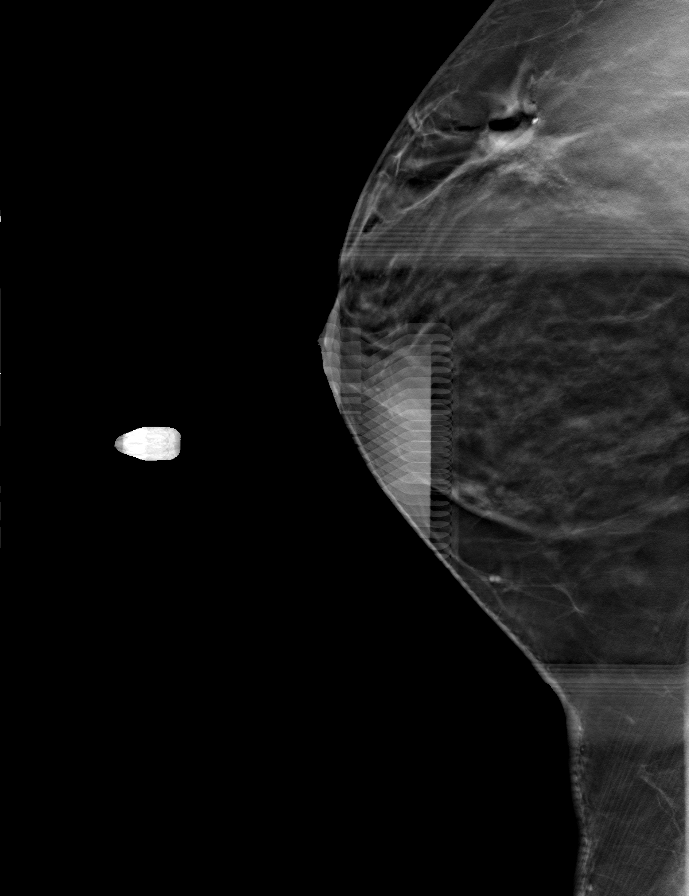

[R ML tomo (2 of 3) · tomo slice 35/70.0]
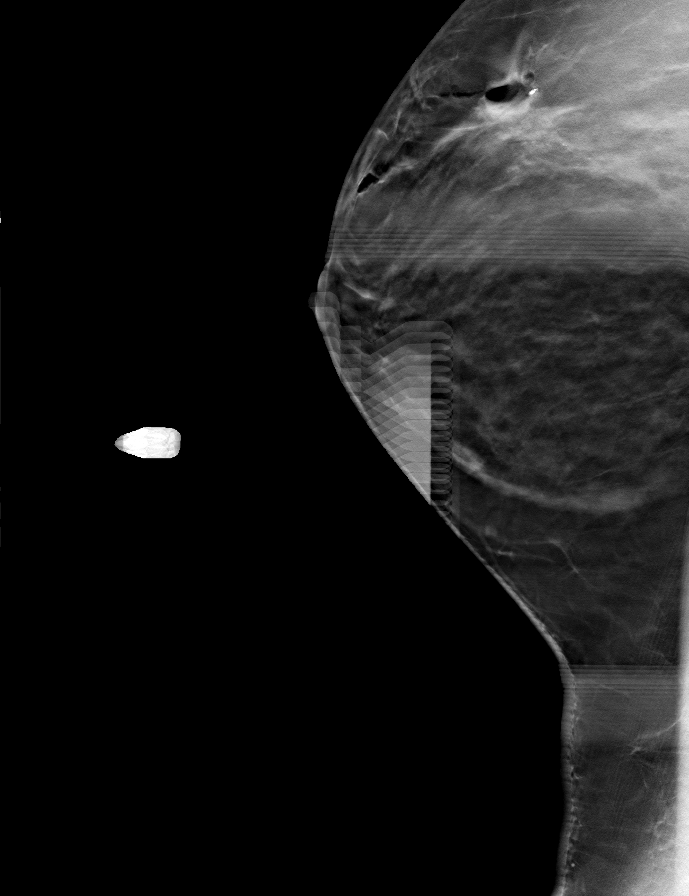

[R ML tomo (3 of 3) · tomo slice 35/70.0]
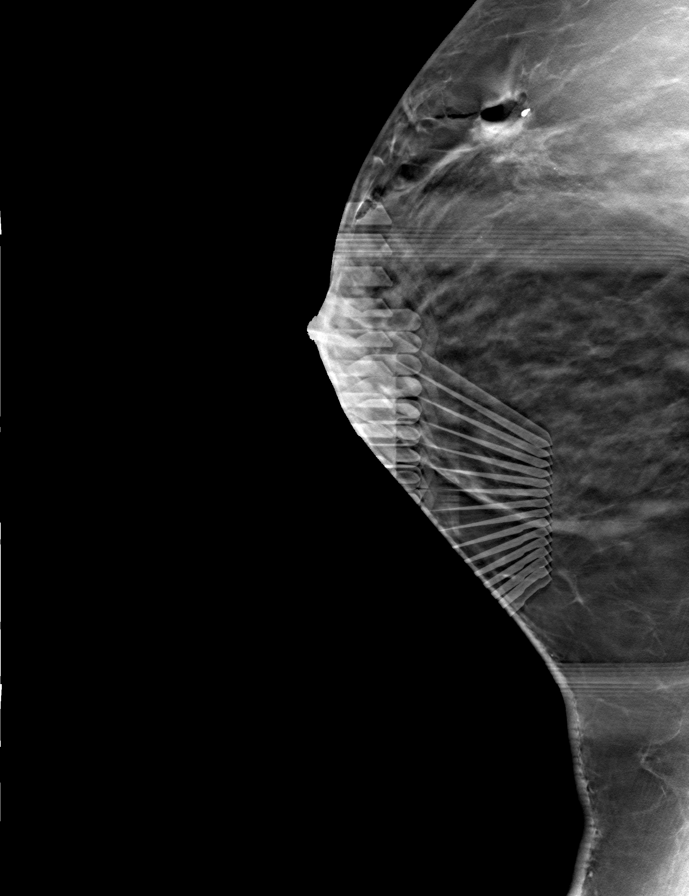

[7 of 18 positions shown; findings below may reference images not displayed]



Site 1: Using sterile technique and 1% Lidocaine as local
anesthetic, under stereotactic guidance, a 9 gauge vacuum assisted
device was used to perform core needle biopsy of calcifications in
the upper outer quadrant of the RIGHT breast using a lateral
approach. Specimen radiograph was performed showing calcifications.
Specimens with calcifications are identified for pathology.

Lesion quadrant: Upper outer quadrant

At the conclusion of the procedure, a coil shaped tissue marker clip
was deployed into the biopsy cavity.

Site 2: Next, using sterile technique and 1% Lidocaine as local
anesthetic, under stereotactic guidance, a 9 gauge vacuum assisted
device was used to perform core needle biopsy of calcifications in
the lower inner quadrant of the RIGHT breast using a medial
approach. Specimen radiograph was performed showing calcifications.
Specimens with calcifications are identified for pathology.

Lesion quadrant: Lower inner quadrant

At the conclusion of the procedure, a X shaped tissue marker clip
was deployed into the biopsy cavity. Follow-up 2-view mammogram was
performed and dictated separately.
IMPRESSION: 1. Stereotactic guided biopsy of calcifications within the
upper-outer quadrant of the RIGHT breast. Coil shaped clip placed at
the biopsy site.
2. Stereotactic guided biopsy of calcifications within the lower
inner quadrant of the RIGHT breast. X shaped clip placed at the
biopsy site.

ADDENDUM:
Pathology revealed DUCTAL CARCINOMA IN SITU, INTERMEDIATE NUCLEAR
GRADE WITH CALCIFICATIONS, ARISING IN A COMPLEX SCLEROSING LESION of
the RIGHT breast, upper outer quadrant. This was found to be
concordant by Dr. Playmaker Kamisee.

Pathology revealed FLAT EPITHELIAL ATYPIA (ESTEFO) WITH CALCIFICATIONS
of the RIGHT breast, lower inner quadrant. This was found to be
concordant by Dr. Playmaker Kamisee.

Pathology results were discussed with the patient by telephone. The
patient reported doing well after the biopsy with tenderness at the
site. Post biopsy instructions and care were reviewed and questions
were answered. The patient was encouraged to call The [REDACTED]

If breast conservation therapy is considered, at least 2 additional
stereotactic biopsies are recommended for other similar appearing
calcifications in the RIGHT breast.

Surgical consultation has been arranged with Dr. Taska Gue at
[REDACTED] on November 22, 2018.

Pathology results reported by Kyra Ofori, RN on 11/17/2018.



Site 1: Using sterile technique and 1% Lidocaine as local
anesthetic, under stereotactic guidance, a 9 gauge vacuum assisted
device was used to perform core needle biopsy of calcifications in
the upper outer quadrant of the RIGHT breast using a lateral
approach. Specimen radiograph was performed showing calcifications.
Specimens with calcifications are identified for pathology.

Lesion quadrant: Upper outer quadrant

At the conclusion of the procedure, a coil shaped tissue marker clip
was deployed into the biopsy cavity.

Site 2: Next, using sterile technique and 1% Lidocaine as local
anesthetic, under stereotactic guidance, a 9 gauge vacuum assisted
device was used to perform core needle biopsy of calcifications in
the lower inner quadrant of the RIGHT breast using a medial
approach. Specimen radiograph was performed showing calcifications.
Specimens with calcifications are identified for pathology.

Lesion quadrant: Lower inner quadrant

At the conclusion of the procedure, a X shaped tissue marker clip
was deployed into the biopsy cavity. Follow-up 2-view mammogram was
performed and dictated separately.
IMPRESSION: 1. Stereotactic guided biopsy of calcifications within the
upper-outer quadrant of the RIGHT breast. Coil shaped clip placed at
the biopsy site.
2. Stereotactic guided biopsy of calcifications within the lower
inner quadrant of the RIGHT breast. X shaped clip placed at the
biopsy site.

## 2019-11-05 IMAGING — MG STEREOTACTIC VACUUM ASSIST RIGHT
7 of 17 series · 7 of 29 positions shown · non-contrast
Comparison: Previous exams.
COMPARISON: Previous exams.

Addendum:
CLINICAL DATA: Patient with 2 groups of calcifications within the
RIGHT breast presents today for stereotactic biopsies.

EXAM:
RIGHT BREAST STEREOTACTIC CORE NEEDLE BIOPSY x2

[R (1 of 7)]
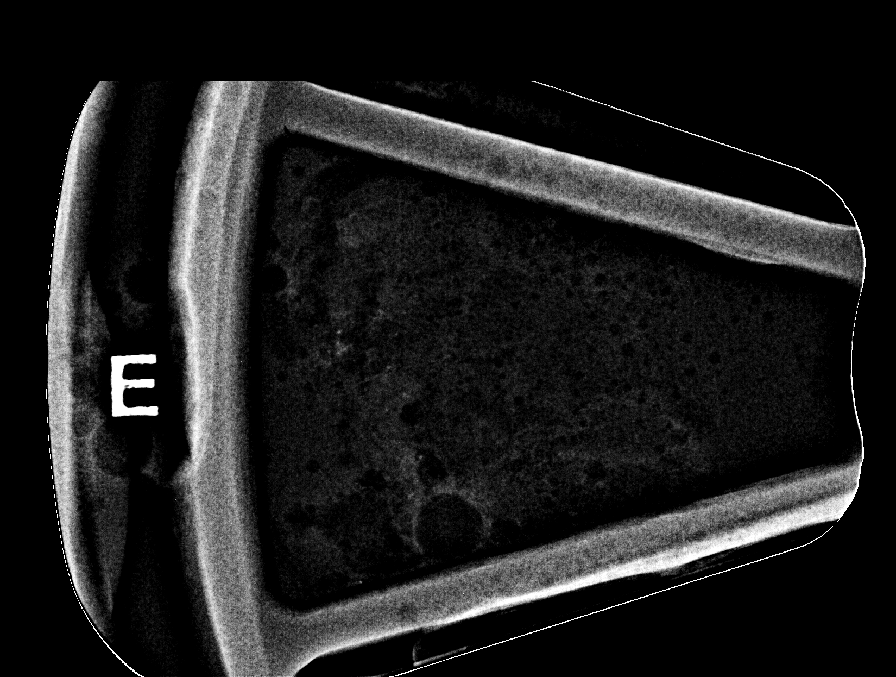

[R (2 of 7)]
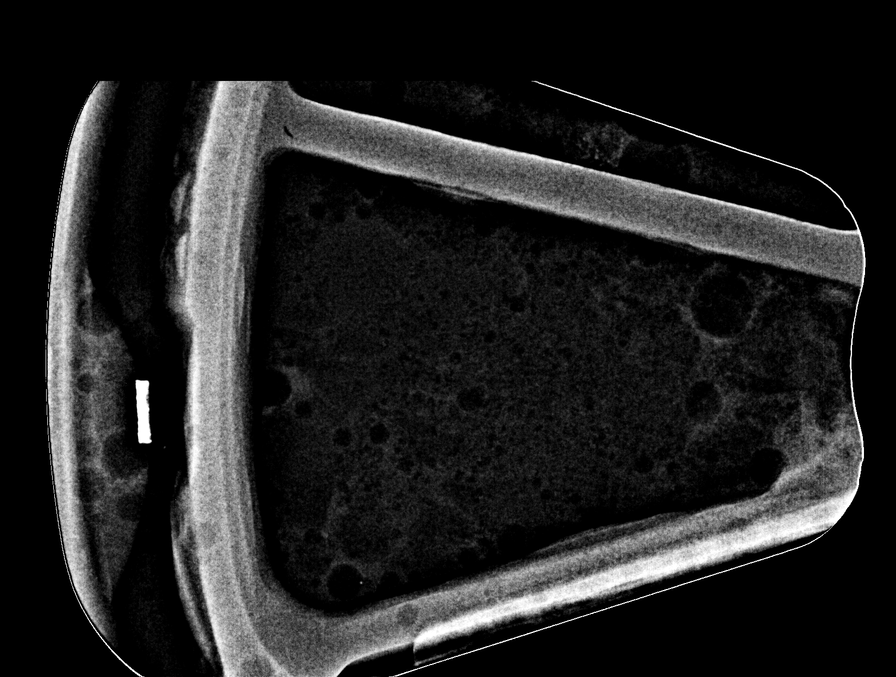

[R (3 of 7)]
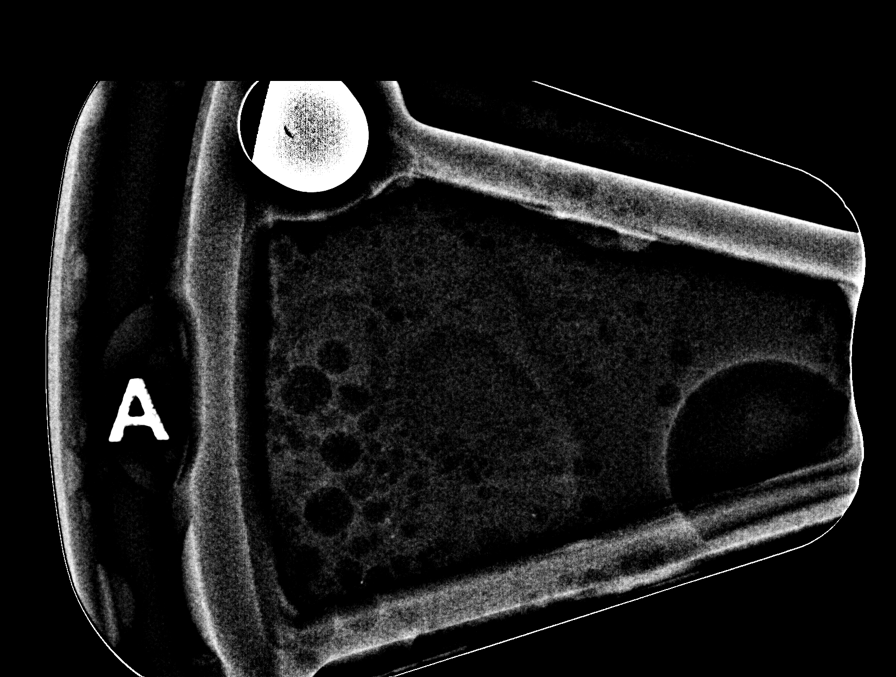

[R (4 of 7)]
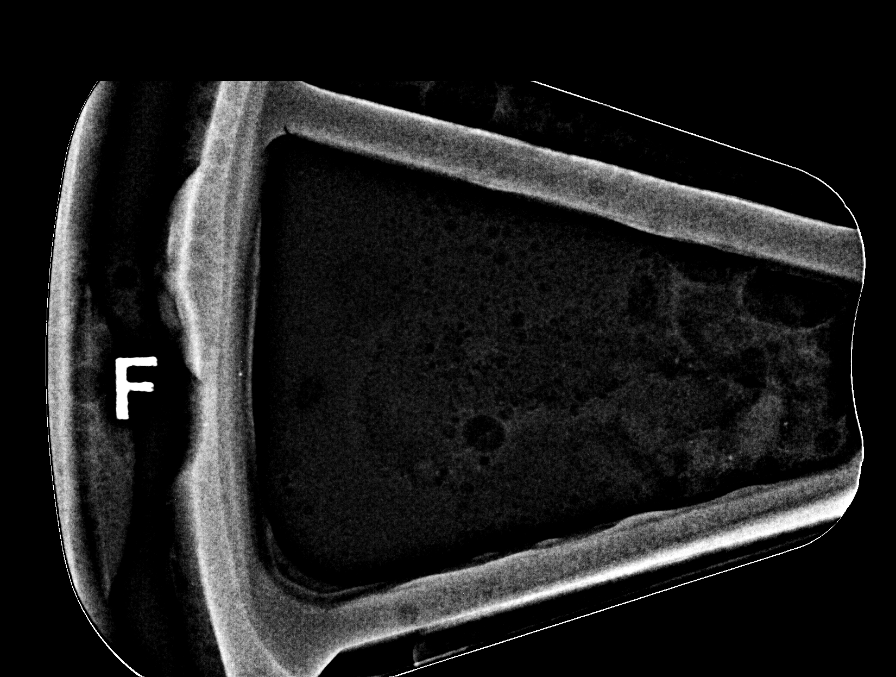

[R (5 of 7)]
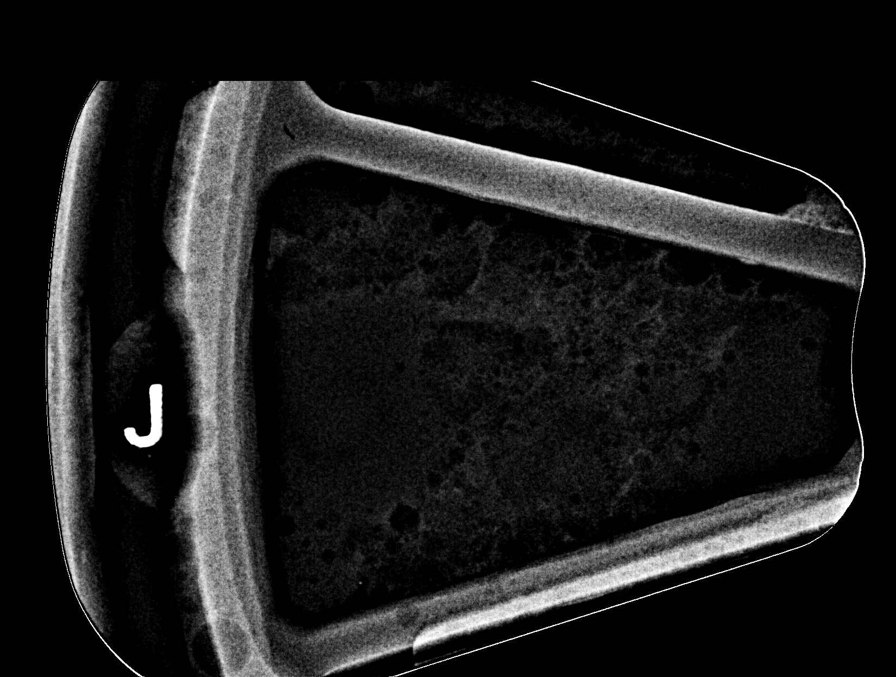

[R (6 of 7)]
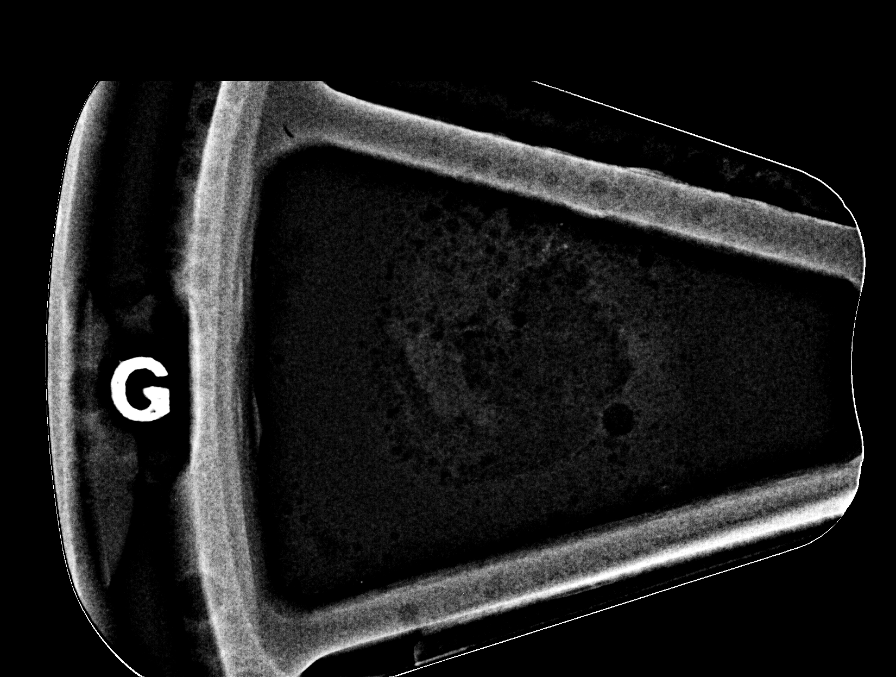

[R (7 of 7)]
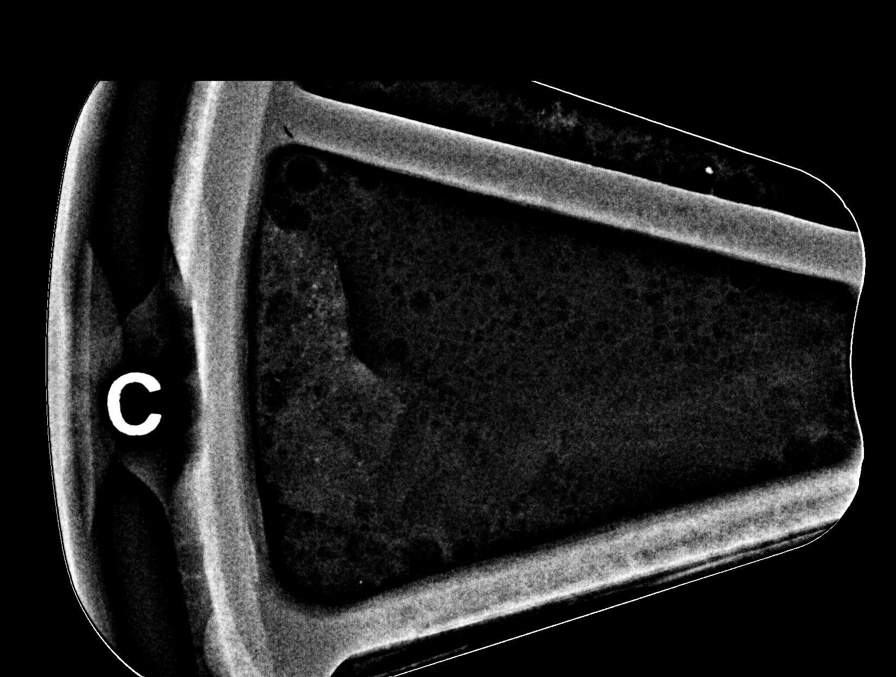

[7 of 29 positions shown; findings below may reference images not displayed]



Site 1: Using sterile technique and 1% Lidocaine as local
anesthetic, under stereotactic guidance, a 9 gauge vacuum assisted
device was used to perform core needle biopsy of calcifications in
the upper outer quadrant of the RIGHT breast using a lateral
approach. Specimen radiograph was performed showing calcifications.
Specimens with calcifications are identified for pathology.

Lesion quadrant: Upper outer quadrant

At the conclusion of the procedure, a coil shaped tissue marker clip
was deployed into the biopsy cavity.

Site 2: Next, using sterile technique and 1% Lidocaine as local
anesthetic, under stereotactic guidance, a 9 gauge vacuum assisted
device was used to perform core needle biopsy of calcifications in
the lower inner quadrant of the RIGHT breast using a medial
approach. Specimen radiograph was performed showing calcifications.
Specimens with calcifications are identified for pathology.

Lesion quadrant: Lower inner quadrant

At the conclusion of the procedure, a X shaped tissue marker clip
was deployed into the biopsy cavity. Follow-up 2-view mammogram was
performed and dictated separately.
IMPRESSION: 1. Stereotactic guided biopsy of calcifications within the
upper-outer quadrant of the RIGHT breast. Coil shaped clip placed at
the biopsy site.
2. Stereotactic guided biopsy of calcifications within the lower
inner quadrant of the RIGHT breast. X shaped clip placed at the
biopsy site.

ADDENDUM:
Pathology revealed DUCTAL CARCINOMA IN SITU, INTERMEDIATE NUCLEAR
GRADE WITH CALCIFICATIONS, ARISING IN A COMPLEX SCLEROSING LESION of
the RIGHT breast, upper outer quadrant. This was found to be
concordant by Dr. Playmaker Kamisee.

Pathology revealed FLAT EPITHELIAL ATYPIA (ESTEFO) WITH CALCIFICATIONS
of the RIGHT breast, lower inner quadrant. This was found to be
concordant by Dr. Playmaker Kamisee.

Pathology results were discussed with the patient by telephone. The
patient reported doing well after the biopsy with tenderness at the
site. Post biopsy instructions and care were reviewed and questions
were answered. The patient was encouraged to call The [REDACTED]

If breast conservation therapy is considered, at least 2 additional
stereotactic biopsies are recommended for other similar appearing
calcifications in the RIGHT breast.

Surgical consultation has been arranged with Dr. Taska Gue at
[REDACTED] on November 22, 2018.

Pathology results reported by Kyra Ofori, RN on 11/17/2018.



Site 1: Using sterile technique and 1% Lidocaine as local
anesthetic, under stereotactic guidance, a 9 gauge vacuum assisted
device was used to perform core needle biopsy of calcifications in
the upper outer quadrant of the RIGHT breast using a lateral
approach. Specimen radiograph was performed showing calcifications.
Specimens with calcifications are identified for pathology.

Lesion quadrant: Upper outer quadrant

At the conclusion of the procedure, a coil shaped tissue marker clip
was deployed into the biopsy cavity.

Site 2: Next, using sterile technique and 1% Lidocaine as local
anesthetic, under stereotactic guidance, a 9 gauge vacuum assisted
device was used to perform core needle biopsy of calcifications in
the lower inner quadrant of the RIGHT breast using a medial
approach. Specimen radiograph was performed showing calcifications.
Specimens with calcifications are identified for pathology.

Lesion quadrant: Lower inner quadrant

At the conclusion of the procedure, a X shaped tissue marker clip
was deployed into the biopsy cavity. Follow-up 2-view mammogram was
performed and dictated separately.
IMPRESSION: 1. Stereotactic guided biopsy of calcifications within the
upper-outer quadrant of the RIGHT breast. Coil shaped clip placed at
the biopsy site.
2. Stereotactic guided biopsy of calcifications within the lower
inner quadrant of the RIGHT breast. X shaped clip placed at the
biopsy site.

## 2019-11-10 ENCOUNTER — Inpatient Hospital Stay: Payer: 59 | Attending: Oncology | Admitting: Adult Health

## 2019-12-22 ENCOUNTER — Other Ambulatory Visit: Payer: Self-pay | Admitting: Oncology

## 2019-12-23 ENCOUNTER — Ambulatory Visit: Payer: 59 | Attending: Internal Medicine

## 2019-12-23 DIAGNOSIS — Z23 Encounter for immunization: Secondary | ICD-10-CM

## 2019-12-23 NOTE — Progress Notes (Signed)
   Covid-19 Vaccination Clinic  Name:  Angela Huff    MRN: [id number] DOB: 1965-09-19  12/23/2019  [name] was observed post Covid-19 immunization for 15 minutes without incident. She was provided with Vaccine Information Sheet and instruction to access the V-Safe system.   [name] was instructed to call 911 with any severe reactions post vaccine: Marland Kitchen Difficulty breathing  . Swelling of face and throat  . A fast heartbeat  . A bad rash all over body  . Dizziness and weakness   Immunizations Administered    Name Date Dose VIS Date Route   Pfizer COVID-19 Vaccine 12/23/2019 11:26 AM 0.3 mL 10/11/2018 Intramuscular   Manufacturer: [place]   Lot: [id number]   [place]: [id number]

## 2020-01-16 ENCOUNTER — Ambulatory Visit: Payer: 59 | Attending: Internal Medicine

## 2020-01-16 DIAGNOSIS — Z23 Encounter for immunization: Secondary | ICD-10-CM

## 2020-01-16 NOTE — Progress Notes (Signed)
   Covid-19 Vaccination Clinic  Name:  Angela Huff    MRN: [id number] DOB: September 20, 1965  01/16/2020  [name] was observed post Covid-19 immunization for 15 minutes without incident. She was provided with Vaccine Information Sheet and instruction to access the V-Safe system.   [name] was instructed to call 911 with any severe reactions post vaccine: Marland Kitchen Difficulty breathing  . Swelling of face and throat  . A fast heartbeat  . A bad rash all over body  . Dizziness and weakness   Immunizations Administered    Name Date Dose VIS Date Route   Pfizer COVID-19 Vaccine 01/16/2020 11:22 AM 0.3 mL 10/11/2018 Intramuscular   Manufacturer: [employer]   Lot: [id number]   [place]: [id number]

## 2020-10-01 ENCOUNTER — Inpatient Hospital Stay: Payer: 59 | Admitting: Oncology

## 2020-10-01 ENCOUNTER — Inpatient Hospital Stay: Payer: 59

## 2020-10-28 ENCOUNTER — Inpatient Hospital Stay: Payer: 59 | Admitting: Oncology

## 2020-10-28 ENCOUNTER — Inpatient Hospital Stay: Payer: 59

## 2020-11-24 NOTE — Progress Notes (Signed)
[place]  Telephone:(336) [phone] Fax:(336) [phone]    ID: Angela Huff DOB: [birth year]  MR#: [id number]  LKT#:[id number]  Patient Care Team: Lawerance Cruel, MD as PCP - General (Family Medicine) Mauro Kaufmann, RN as Oncology Nurse Navigator Rockwell Germany, RN as Oncology Nurse Navigator Fredric Slabach, Virgie Dad, MD as Consulting Physician (Oncology) Stark Klein, MD as Consulting Physician (General Surgery) Paula Compton, MD as Consulting Physician (Obstetrics and Gynecology) Allyn Kenner, MD (Dermatology) Sheliah Hatch, MD as Referring Physician (Surgical Oncology) Jacinto Reap, MD (Plastic Surgery) Kimmick, Linus Mako, MD as Referring Physician (Oncology) OTHER MD:   CHIEF COMPLAINT: 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 on 03/27/2020 showing: breast with scattered areas of fibroglandular density; no evidence of malignancy.    REVIEW OF SYSTEMS: [place]    COVID 19 VACCINATION STATUS: [place] x2, most recently 01/2020   HISTORY OF CURRENT ILLNESS: From the original intake note:  Angela Huff had routine screening mammography on 11/01/2018 showing a possible abnormality in the right breast. She underwent unilateral right diagnostic mammography with tomography and right breast ultrasonography at [facility] on 11/14/2018 showing: Breast Density Category C. Grouped punctate and amorphous calcifications are confirmed within the lower inner quadrant of the right breast, at posterior depth, measuring 6 mm extent. Additional loosely grouped punctate and amorphous calcifications are confirmed within the upper-outer quadrant of the right breast  and central/retroareolar right breast, measuring 4.5 cm greatest extent and 7 mm extent respectively.Targeted ultrasound is performed, evaluating the upper-outer quadrant of the right breast, showing scattered benign cysts, some simple and some complicated with internal debris, largest measuring 7 mm. No suspicious solid or cystic mass is identified by ultrasound. Right axilla was evaluated with ultrasound showing no enlarged or morphologically abnormal lymph nodes.  Accordingly on 11/15/2018 she proceeded to biopsy of the right breast area in question. The pathology from this procedure showed ([id number]): ductal carcinoma in situ, intermediate nuclear grade with calcifications, arising in a complex sclerosing lesion. Prognostic indicators significant for: estrogen receptor, 95% positive and progesterone receptor, 95% positive, both with strong staining intensity.  An additional biopsy of the right breast was performed on the same day showing: 2. Breast, right, needle core biopsy, lower inner quadrant   - Flat epithelial atypia (FEA) with calcifications.   The patient's subsequent history is as detailed below.   PAST MEDICAL HISTORY: Past Medical History:  Diagnosis Date  . Family history of breast cancer   . Family history of kidney cancer   . Family history of stomach cancer   Mild allergic asthma   PAST SURGICAL HISTORY: No past surgical history on file. Schwannoma removal left facial nerve area 2008 Placement platinum weight left upper lid   FAMILY HISTORY: Family History  Problem Relation Age of Onset  . Breast cancer Maternal Grandmother 32       d. 52  . Stomach cancer Paternal Grandmother 68  . Skin cancer Paternal Grandmother   . Skin cancer Father 88       d. 38  . Kidney cancer Brother   . Heart Problems Paternal Uncle   . Drug abuse Brother        d. 71  .  Breast cancer Maternal Great-grandmother 51       MGF's mother  The patient's father died from slowly but  relentlessly invasive basal cell carcinoma at the age of 34.  The patient's mother passed away unexpectedly in 2019-01-01 at age 34.  The patient has 1 brother who died at the age of 22 from a drug overdose, 2 older brothers, and 1 sister, with no history of cancer in the immediate family.  The patient's mother's mother was diagnosed with breast cancer at age 64 and died at age 60.  That grandmother had brothers, no sisters.    GYNECOLOGIC HISTORY:  No LMP recorded. 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  Her husband Ethelle Lyon") works in Interior and spatial designer for [employer].  Their children are Angela Huff, 25, who is teaching [place] in [place]; Angela Huff, 23, who is home, and his son Angela Huff 17 who is home.   ADVANCED DIRECTIVES: The patient's husband is her healthcare power of attorney   HEALTH MAINTENANCE:    Colonoscopy: [facility], 2019  PAP: Up-to-date/[facility]  Bone density: Never  No Known Allergies  Current Outpatient Medications  Medication Sig Dispense Refill  . albuterol (PROVENTIL HFA;VENTOLIN HFA) 108 (90 Base) MCG/ACT inhaler albuterol sulfate HFA 90 mcg/actuation aerosol inhaler  INHALE 1 TO 2 PUFFS BY MOUTH EVERY 4 HOURS AS NEEDED    . clotrimazole-betamethasone (LOTRISONE) cream clotrimazole-betamethasone 1 %-0.05 % topical cream  APPLY TO THE AFFECTED AND SURROUNDING AREAS OF SKIN BY TOPICAL ROUTE 2 TIMES PER DAY IN THE MORNING AND EVENING FOR 2 WEEKS    . hydrochlorothiazide (MICROZIDE) 12.5 MG capsule hydrochlorothiazide 12.5 mg capsule    . omeprazole (PRILOSEC OTC) 20 MG tablet Take by mouth.    . prochlorperazine (COMPAZINE) 5 MG tablet Take 2 tablets (10 mg total) by mouth every 8 (eight)  hours as needed for nausea or vomiting. 60 tablet 1  . tamoxifen (NOLVADEX) 20 MG tablet TAKE 1 TABLET DAILY 90 tablet 3   No current facility-administered medications for this visit.     OBJECTIVE:   There were no vitals filed for this visit.   There is no height or weight on file to calculate BMI.   Wt Readings from Last 3 Encounters:  10/02/19 163 lb (73.9 kg)  04/10/19 179 lb 3.2 oz (81.3 kg)  11/28/18 196 lb 8 oz (89.1 kg)      ECOG FS:    LAB RESULTS:  CMP     Component Value Date/Time   NA 141 10/02/2019 0847   K 3.3 (L) 10/02/2019 0847   CL 103 10/02/2019 0847   CO2 28 10/02/2019 0847   GLUCOSE 99 10/02/2019 0847   BUN 10 10/02/2019 0847   CREATININE 0.80 10/02/2019 0847   CALCIUM 9.2 10/02/2019 0847   PROT 7.1 10/02/2019 0847   ALBUMIN 3.7 10/02/2019 0847   AST 12 (L) 10/02/2019 0847   ALT 11 10/02/2019 0847   ALKPHOS 67 10/02/2019 0847   BILITOT 0.3 10/02/2019 0847   GFRNONAA >60 10/02/2019 0847   GFRAA >60 10/02/2019 0847    No results found for: TOTALPROTELP, ALBUMINELP, A1GS, A2GS, BETS, BETA2SER, GAMS, MSPIKE, SPEI  No results found for: KPAFRELGTCHN, LAMBDASER, KAPLAMBRATIO  Lab Results  Component Value Date   WBC 9.4 10/02/2019   NEUTROABS 4.7 10/02/2019   HGB 14.4 10/02/2019   HCT 42.5 10/02/2019   MCV 88.0 10/02/2019   PLT 325 10/02/2019   No results found for: LABCA2  No components found for: MWUXLK440  No results for input(s): INR in the last 168 hours.  No results found for: LABCA2  No results found for: NUU725  No results found for: DGU440  No results found for: HKV425  No results found for: CA2729  No components found for: HGQUANT  No results found for: CEA1 / No results found for: CEA1   No results found for: AFPTUMOR  No results found for: CHROMOGRNA  No results found for: HGBA, HGBA2QUANT, HGBFQUANT, HGBSQUAN (Hemoglobinopathy evaluation)   No results found for: LDH  No results found for: IRON, TIBC,  IRONPCTSAT (Iron and TIBC)  No results found for: FERRITIN  Urinalysis No results found for: COLORURINE, APPEARANCEUR, LABSPEC, PHURINE, GLUCOSEU, HGBUR, BILIRUBINUR, KETONESUR, PROTEINUR, UROBILINOGEN, NITRITE, LEUKOCYTESUR   STUDIES:  No results found.   ELIGIBLE FOR AVAILABLE RESEARCH PROTOCOL: NO   ASSESSMENT: 55 y.o. [place], [place] woman status post right breast upper outer quadrant biopsy 11/15/2018 showing ductal carcinoma in situ, grade 2, strongly estrogen and progesterone receptor positive  (a) biopsy of a lower inner quadrant area of calcifications 11/15/2018 showed flat epithelial atypia  (b) biopsy of right breast lower inner and upper outer quadrants 12/09/2018 both showed atypical ductal hyperplasia  (1) genetics testing 11/28/2018 through the   Multi-Gene Panel offered by [facility] found no deleterious mutations in AIP, ALK, APC, ATM, AXIN2,BAP1,  BARD1, BLM, BMPR1A, BRCA1, BRCA2, BRIP1, CASR, CDC73, CDH1, CDK4, CDKN1B, CDKN1C, CDKN2A (p14ARF), CDKN2A (p16INK4a), CEBPA, CHEK2, CTNNA1, DICER1, DIS3L2, EGFR (c.2369C>T, p.Thr790Met variant only), EPCAM (Deletion/duplication testing only), FH, FLCN, GATA2, GPC3, GREM1 (Promoter region deletion/duplication testing only), HOXB13 (c.251G>A, p.Gly84Glu), HRAS, KIT, MAX, MEN1, MET, MITF (c.952G>A, p.Glu318Lys variant only), MLH1, MSH2, MSH3, MSH6, MUTYH, NBN, NF1, NF2, NTHL1, PALB2, PDGFRA, PHOX2B, PMS2, POLD1, POLE, POT1, PRKAR1A, PTCH1, PTEN, RAD50, RAD51C, RAD51D, RB1, RECQL4, RET, RNF43, RUNX1, SDHAF2, SDHA (sequence changes only), SDHB, SDHC, SDHD, SMAD4, SMARCA4, SMARCB1, SMARCE1, STK11, SUFU, TERC, TERT, TMEM127, TP53, TSC1, TSC2, VHL, WRN and WT1.  The report date is December 05, 2018.  (a) CASR c.1211T>G VUS identified.   (2) tamoxifen started 11/28/2018, stopped perioperatively, resumed 04/18/2019  (3) status post right mastectomy and sentinel lymph node sampling 03/07/2019 for a pT1a pN0, stage IA invasive mucinous carcinoma,  grade 2 with negative margins, prognostic panel not obtained  (a) status post immediate stacked DIEP reconstruction   (4) no indication for adjuvant radiation    PLAN: Gini did not show for her 11/25/2020 visit.  A follow-up letter has been sent.  Azizah Lisle, Virgie Dad, MD  11/25/20 6:01 PM Medical Oncology and Hematology [facility] [place], [place] [address] Tel. [phone]    Fax. [phone]    I, Wilburn Mylar, am acting as scribe for Dr. Virgie Dad. Anwar Crill.  I, Lurline Del MD, have reviewed the above documentation for accuracy and completeness, and I agree with the above.   *Total Encounter Time as defined by the Centers for Medicare and Medicaid Services includes, in addition to the face-to-face time of a patient visit (documented in the note above) non-face-to-face time: obtaining and reviewing outside history, ordering and reviewing medications, tests or procedures, care coordination (communications with other health care professionals or caregivers) and documentation in the medical record.

## 2020-11-25 ENCOUNTER — Inpatient Hospital Stay: Payer: 59 | Attending: Oncology

## 2020-11-25 ENCOUNTER — Encounter: Payer: Self-pay | Admitting: Oncology

## 2020-11-25 ENCOUNTER — Inpatient Hospital Stay (HOSPITAL_BASED_OUTPATIENT_CLINIC_OR_DEPARTMENT_OTHER): Payer: 59 | Admitting: Oncology

## 2020-11-25 DIAGNOSIS — C50411 Malignant neoplasm of upper-outer quadrant of right female breast: Secondary | ICD-10-CM

## 2020-11-25 DIAGNOSIS — Z170 Estrogen receptor positive status [ER+]: Secondary | ICD-10-CM

## 2021-01-26 ENCOUNTER — Other Ambulatory Visit: Payer: Self-pay | Admitting: Oncology

## 2021-10-09 ENCOUNTER — Telehealth: Payer: Self-pay | Admitting: Hematology and Oncology

## 2021-10-09 NOTE — Telephone Encounter (Signed)
.  Called patient to schedule appointment per 2/21 inbasket, patient is aware of date and time.   °

## 2021-10-14 ENCOUNTER — Other Ambulatory Visit: Payer: Self-pay | Admitting: Family Medicine

## 2021-10-14 DIAGNOSIS — Z8249 Family history of ischemic heart disease and other diseases of the circulatory system: Secondary | ICD-10-CM

## 2021-11-12 ENCOUNTER — Other Ambulatory Visit: Payer: Self-pay

## 2021-11-12 ENCOUNTER — Encounter: Payer: Self-pay | Admitting: Hematology and Oncology

## 2021-11-12 ENCOUNTER — Inpatient Hospital Stay: Payer: 59 | Attending: Hematology and Oncology

## 2021-11-12 ENCOUNTER — Inpatient Hospital Stay (HOSPITAL_BASED_OUTPATIENT_CLINIC_OR_DEPARTMENT_OTHER): Payer: 59 | Admitting: Hematology and Oncology

## 2021-11-12 ENCOUNTER — Other Ambulatory Visit: Payer: Self-pay | Admitting: *Deleted

## 2021-11-12 VITALS — BP 124/91 | HR 98 | Temp 98.1°F | Resp 18 | Ht 68.0 in | Wt 166.2 lb

## 2021-11-12 DIAGNOSIS — D0511 Intraductal carcinoma in situ of right breast: Secondary | ICD-10-CM

## 2021-11-12 DIAGNOSIS — Z8051 Family history of malignant neoplasm of kidney: Secondary | ICD-10-CM | POA: Diagnosis not present

## 2021-11-12 DIAGNOSIS — Z803 Family history of malignant neoplasm of breast: Secondary | ICD-10-CM | POA: Insufficient documentation

## 2021-11-12 DIAGNOSIS — Z170 Estrogen receptor positive status [ER+]: Secondary | ICD-10-CM | POA: Diagnosis not present

## 2021-11-12 DIAGNOSIS — Z800 Family history of malignant neoplasm of digestive organs: Secondary | ICD-10-CM | POA: Insufficient documentation

## 2021-11-12 DIAGNOSIS — C50411 Malignant neoplasm of upper-outer quadrant of right female breast: Secondary | ICD-10-CM

## 2021-11-12 DIAGNOSIS — Z853 Personal history of malignant neoplasm of breast: Secondary | ICD-10-CM | POA: Diagnosis present

## 2021-11-12 DIAGNOSIS — Z808 Family history of malignant neoplasm of other organs or systems: Secondary | ICD-10-CM | POA: Insufficient documentation

## 2021-11-12 LAB — CMP (CANCER CENTER ONLY)
ALT: 13 U/L (ref 0–44)
AST: 14 U/L — ABNORMAL LOW (ref 15–41)
Albumin: 3.7 g/dL (ref 3.5–5.0)
Alkaline Phosphatase: 49 U/L (ref 38–126)
Anion gap: 7 (ref 5–15)
BUN: 10 mg/dL (ref 6–20)
CO2: 28 mmol/L (ref 22–32)
Calcium: 8.9 mg/dL (ref 8.9–10.3)
Chloride: 106 mmol/L (ref 98–111)
Creatinine: 0.82 mg/dL (ref 0.44–1.00)
GFR, Estimated: >60 mL/min (ref >60)
Glucose, Bld: 117 mg/dL — ABNORMAL HIGH (ref 70–99)
Potassium: 3.4 mmol/L — ABNORMAL LOW (ref 3.5–5.1)
Sodium: 141 mmol/L (ref 135–145)
Total Bilirubin: 0.4 mg/dL (ref 0.3–1.2)
Total Protein: 6.5 g/dL (ref 6.5–8.1)

## 2021-11-12 LAB — CBC WITH DIFFERENTIAL (CANCER CENTER ONLY)
Abs Immature Granulocytes: 0.02 10*3/uL (ref 0.00–0.07)
Basophils Absolute: 0.1 10*3/uL (ref 0.0–0.1)
Basophils Relative: 1 %
Eosinophils Absolute: 0.1 10*3/uL (ref 0.0–0.5)
Eosinophils Relative: 2 %
HCT: 39.8 % (ref 36.0–46.0)
Hemoglobin: 13.8 g/dL (ref 12.0–15.0)
Immature Granulocytes: 0 %
Lymphocytes Relative: 42 %
Lymphs Abs: 3.4 10*3/uL (ref 0.7–4.0)
MCH: 30.3 pg (ref 26.0–34.0)
MCHC: 34.7 g/dL (ref 30.0–36.0)
MCV: 87.3 fL (ref 80.0–100.0)
Monocytes Absolute: 0.6 10*3/uL (ref 0.1–1.0)
Monocytes Relative: 8 %
Neutro Abs: 4 10*3/uL (ref 1.7–7.7)
Neutrophils Relative %: 47 %
Platelet Count: 299 10*3/uL (ref 150–400)
RBC: 4.56 MIL/uL (ref 3.87–5.11)
RDW: 12.1 % (ref 11.5–15.5)
WBC Count: 8.3 10*3/uL (ref 4.0–10.5)
nRBC: 0 % (ref 0.0–0.2)

## 2021-11-12 NOTE — Progress Notes (Signed)
?[place]  ?[phone] [phone]  ? ? ?ID: Angela Huff DOB: 1966/07/20  MR#: [id number]  XKG#:[id number] ? ?Patient Care Team: ?Lawerance Cruel, MD as PCP - General (Family Medicine) ?Mauro Kaufmann, RN as Oncology Nurse Navigator ?Rockwell Germany, RN as Oncology Nurse Navigator ?Magrinat, Virgie Dad, MD (Inactive) as Consulting Physician (Oncology) ?Stark Klein, MD as Consulting Physician (General Surgery) ?Paula Compton, MD as Consulting Physician (Obstetrics and Gynecology) ?Allyn Kenner, MD (Dermatology) ?Sheliah Hatch, MD as Referring Physician (Surgical Oncology) ?Jacinto Reap, MD (Plastic Surgery) ?Kimmick, Linus Mako, MD as Referring Physician (Oncology) ?OTHER MD: ? ? 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 ?CURRENT TREATMENT:  tamoxifen ? ?Oncology History  ?Malignant neoplasm of upper-outer quadrant of right breast in female, estrogen receptor positive ([place])  ?11/15/2018 Initial Diagnosis  ? Malignant neoplasm of upper-outer quadrant of right breast in female, estrogen receptor positive ([place]) ?  ?11/23/2018 Cancer Staging  ? Staging form: Breast, AJCC 8th Edition ?- Clinical: Stage 0 (cTis (DCIS), cN0, cM0, ER+, PR+) ? ?  ?12/05/2018 Genetic Testing  ? Negative genetic testing.  CASR c.1211T>G VUS identified.  The Multi-Gene Panel offered by [facility] includes sequencing and/or deletion duplication testing of the following 85 genes: AIP, ALK, APC, ATM, AXIN2,BAP1,  BARD1, BLM, BMPR1A, BRCA1, BRCA2, BRIP1, CASR, CDC73, CDH1, CDK4, CDKN1B, CDKN1C, CDKN2A (p14ARF), CDKN2A (p16INK4a), CEBPA, CHEK2, CTNNA1, DICER1, DIS3L2, EGFR (c.2369C>T, p.Thr790Met variant only), EPCAM (Deletion/duplication testing only), FH, FLCN, GATA2, GPC3, GREM1 (Promoter region deletion/duplication testing only), HOXB13 (c.251G>A, p.Gly84Glu), HRAS, KIT, MAX, MEN1, MET, MITF (c.952G>A,  p.Glu318Lys variant only), MLH1, MSH2, MSH3, MSH6, MUTYH, NBN, NF1, NF2, NTHL1, PALB2, PDGFRA, PHOX2B, PMS2, POLD1, POLE, POT1, PRKAR1A, PTCH1, PTEN, RAD50, RAD51C, RAD51D, RB1, RECQL4, RET, RNF43, RUNX1, SDHAF2, SDHA (sequence changes only), SDHB, SDHC, SDHD, SMAD4, SMARCA4, SMARCB1, SMARCE1, STK11, SUFU, TERC, TERT, TMEM127, TP53, TSC1, TSC2, VHL, WRN and WT1.  The report date is December 05, 2018. ?  ?11/2018 - 11/2023 Anti-estrogen oral therapy  ? Tamoxifen. Was stopped perioperatively and resumed on 04/18/2019. ?  ?03/07/2019 Cancer Staging  ? Staging form: Breast, AJCC 8th Edition ?- Pathologic stage from 03/07/2019: Stage IA (pT1a, pN0, cM0, G2, ER+, PR+, HER2-)  ?  ?03/07/2019 Surgery  ? Right mastectomy ([facility], [facility]): stage IA invasive mucinous carcinoma, grade 2 with negative margins, prognostic panel not obtained. ER+, PR+, HER2-. ?  ? ? ?INTERVAL HISTORY: ? ?She missed her last appointment with Dr. Jana Hakim, she said her daughter was getting married.  She had a mammogram in August 2022 which was unremarkable, no evidence of cancer in the left breast. ?She is doing quite well except she is worried about some, fibroid enlargement, ovarian cysts and also endometrial thickness.  She has another follow-up with her gynecologist in May 2023.  She denies any breakthrough bleeding.  No asymmetrical lower extremity swelling or change in her breathing.  She appears to have had some labs in February 2023 which showed an estradiol levels of 590 hence she is still premenopausal. ?She denies any changes in her breast otherwise.  Rest of the pertinent 10 point ROS reviewed and negative. ? ? COVID 19 VACCINATION STATUS: [place] x2, most recently 01/2020 ? ? ?PAST MEDICAL HISTORY: ?Past Medical History:  ?Diagnosis Date  ? Family history of breast cancer   ? Family history of kidney cancer   ? Family history of stomach cancer   ?Mild allergic asthma ? ? ?  PAST SURGICAL HISTORY: ?History reviewed. No pertinent surgical  history. ?Schwannoma removal left facial nerve area 01-27-07 ?Placement platinum weight left upper lid ? ? ?FAMILY HISTORY: ?Family History  ?Problem Relation Age of Onset  ? Breast cancer Maternal Grandmother 32  ?     d. 77  ? Stomach cancer Paternal Grandmother 90  ? Skin cancer Paternal Grandmother   ? Skin cancer Father 65  ?     d. 78  ? Kidney cancer Brother   ? Heart Problems Paternal Uncle   ? Drug abuse Brother   ?     d. 7  ? Breast cancer Maternal Great-grandmother 73  ?     MGF's mother  ?The patient's father died from slowly but relentlessly invasive basal cell carcinoma at the age of 70.  The patient's mother passed away unexpectedly in 01/27/2019 at age 64.  The patient has 1 brother who died at the age of 75 from a drug overdose, 2 older brothers, and 1 sister, with no history of cancer in the immediate family.  The patient's mother's mother was diagnosed with breast cancer at age 62 and died at age 58.  That grandmother had brothers, no sisters.  ? ? ?GYNECOLOGIC HISTORY:  ?No LMP recorded. ?Menarche: 56 years old ?Age at first live birth: 56 years old years old ?GX P: 3 ?LMP: Still spotting; recent lab work shows her to be still premenopausal ?Contraceptive: Barrier methods ?HRT: No  ?Hysterectomy?:  No ?BSO?:  No ? ? 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  Her husband [name]") works in Print production planner for [employer].  Their children are [name], 25, who is teaching [place] in [place]; [place], 23, who is home, and his son [name] 17 who is home. ? ? ADVANCED DIRECTIVES: The patient's husband is her healthcare power of attorney ? ? ?HEALTH MAINTENANCE: ?  ? Colonoscopy: [facility], January 26, 2018 ? PAP: Up-to-date/[facility] ? Bone density: Never ? ?No Known Allergies ? ?Current Outpatient Medications  ?Medication Sig Dispense Refill  ? albuterol (PROVENTIL HFA;VENTOLIN HFA) 108 (90 Base) MCG/ACT inhaler albuterol sulfate HFA 90 mcg/actuation aerosol  inhaler ? INHALE 1 TO 2 PUFFS BY MOUTH EVERY 4 HOURS AS NEEDED    ? clotrimazole-betamethasone (LOTRISONE) cream clotrimazole-betamethasone 1 %-0.05 % topical cream ? APPLY TO THE AFFECTED AND SURROUNDING AREAS OF SKIN BY TOPICAL ROUTE 2 TIMES PER DAY IN THE MORNING AND EVENING FOR 2 WEEKS    ? hydrochlorothiazide (MICROZIDE) 12.5 MG capsule hydrochlorothiazide 12.5 mg capsule    ? omeprazole (PRILOSEC OTC) 20 MG tablet Take by mouth.    ? prochlorperazine (COMPAZINE) 5 MG tablet Take 2 tablets (10 mg total) by mouth every 8 (eight) hours as needed for nausea or vomiting. 60 tablet 1  ? tamoxifen (NOLVADEX) 20 MG tablet TAKE 1 TABLET DAILY 90 tablet 3  ? ?No current facility-administered medications for this visit.  ? ? ? ?OBJECTIVE:  ? ?Vitals:  ? 11/12/21 1306  ?BP: (!) 124/91  ?Pulse: 98  ?Resp: 18  ?Temp: 98.1 ?F (36.7 ?C)  ?SpO2: 98%  ?   Body mass index is 25.27 kg/m?.   ?Wt Readings from Last 3 Encounters:  ?11/12/21 166 lb 3.2 oz (75.4 kg)  ?10/02/19 163 lb (73.9 kg)  ?04/10/19 179 lb 3.2 oz (81.3 kg)  ? ? ?  ECOG FS: ? ?Physical Exam ?Constitutional:   ?   Appearance: Normal appearance.  ?HENT:  ?  Head: Normocephalic and atraumatic.  ?Chest:  ?   Comments: Bilateral breasts inspected.  Right breast status post mastectomy and reconstruction.  No palpable masses or regional adenopathy in the left breast. ?Neurological:  ?   Mental Status: She is alert.  ? ? ? ?LAB RESULTS: ? ?CMP  ?   ?Component Value Date/Time  ? NA 141 11/12/2021 1257  ? K 3.4 (L) 11/12/2021 1257  ? CL 106 11/12/2021 1257  ? CO2 28 11/12/2021 1257  ? GLUCOSE 117 (H) 11/12/2021 1257  ? BUN 10 11/12/2021 1257  ? CREATININE 0.82 11/12/2021 1257  ? CALCIUM 8.9 11/12/2021 1257  ? PROT 6.5 11/12/2021 1257  ? ALBUMIN 3.7 11/12/2021 1257  ? AST 14 (L) 11/12/2021 1257  ? ALT 13 11/12/2021 1257  ? ALKPHOS 49 11/12/2021 1257  ? BILITOT 0.4 11/12/2021 1257  ? GFRNONAA >60 11/12/2021 1257  ? GFRAA >60 10/02/2019 0847  ? ? ?No results found for:  TOTALPROTELP, ALBUMINELP, A1GS, A2GS, BETS, BETA2SER, GAMS, MSPIKE, SPEI ? ?No results found for: KPAFRELGTCHN, LAMBDASER, KAPLAMBRATIO ? ?Lab Results  ?Component Value Date  ? WBC 8.3 11/12/2021  ? NEUTROABS 4.0 11/13/18

## 2021-11-21 ENCOUNTER — Ambulatory Visit
Admission: RE | Admit: 2021-11-21 | Discharge: 2021-11-21 | Disposition: A | Discharge status: Home / Self Care | Payer: No Typology Code available for payment source | Source: Ambulatory Visit | Attending: Family Medicine | Admitting: Family Medicine

## 2021-11-21 DIAGNOSIS — Z8249 Family history of ischemic heart disease and other diseases of the circulatory system: Secondary | ICD-10-CM

## 2022-01-15 ENCOUNTER — Inpatient Hospital Stay: Payer: 59 | Admitting: Hematology and Oncology

## 2022-02-10 ENCOUNTER — Encounter: Payer: Self-pay | Admitting: Hematology and Oncology

## 2022-02-10 ENCOUNTER — Inpatient Hospital Stay: Payer: 59 | Attending: Hematology and Oncology | Admitting: Hematology and Oncology

## 2022-02-10 ENCOUNTER — Other Ambulatory Visit: Payer: Self-pay

## 2022-02-10 VITALS — BP 130/94 | HR 114 | Temp 97.8°F | Resp 16 | Wt 176.2 lb

## 2022-02-10 DIAGNOSIS — D0511 Intraductal carcinoma in situ of right breast: Secondary | ICD-10-CM

## 2022-02-10 DIAGNOSIS — Z170 Estrogen receptor positive status [ER+]: Secondary | ICD-10-CM | POA: Diagnosis not present

## 2022-02-10 DIAGNOSIS — Z800 Family history of malignant neoplasm of digestive organs: Secondary | ICD-10-CM | POA: Diagnosis not present

## 2022-02-10 DIAGNOSIS — C50411 Malignant neoplasm of upper-outer quadrant of right female breast: Secondary | ICD-10-CM

## 2022-02-10 DIAGNOSIS — Z803 Family history of malignant neoplasm of breast: Secondary | ICD-10-CM | POA: Diagnosis not present

## 2022-02-10 DIAGNOSIS — Z79810 Long term (current) use of selective estrogen receptor modulators (SERMs): Secondary | ICD-10-CM | POA: Diagnosis not present

## 2022-02-10 DIAGNOSIS — Z9011 Acquired absence of right breast and nipple: Secondary | ICD-10-CM | POA: Insufficient documentation

## 2022-02-10 DIAGNOSIS — Z8051 Family history of malignant neoplasm of kidney: Secondary | ICD-10-CM | POA: Diagnosis not present

## 2022-02-14 HISTORY — PX: EYE SURGERY: SHX253

## 2022-02-27 ENCOUNTER — Encounter: Payer: Self-pay | Admitting: Internal Medicine

## 2022-02-27 ENCOUNTER — Ambulatory Visit (INDEPENDENT_AMBULATORY_CARE_PROVIDER_SITE_OTHER): Payer: 59 | Admitting: Internal Medicine

## 2022-02-27 VITALS — BP 110/80 | HR 102 | Ht 67.0 in | Wt 171.2 lb

## 2022-02-27 DIAGNOSIS — J45909 Unspecified asthma, uncomplicated: Secondary | ICD-10-CM

## 2022-02-27 MED ORDER — FLUTICASONE PROPIONATE HFA 44 MCG/ACT IN AERO: 2.0000 | INHALATION_SPRAY | Freq: Two times a day (BID) | RESPIRATORY_TRACT | 3 refills | Status: DC

## 2022-02-27 NOTE — Patient Instructions (Addendum)
Please schedule follow up scheduled with myself in 1 year.  If my schedule is not open yet, we will contact you with a reminder closer to that time. Please call [phone] if you haven't heard from [place] a month before.   PFTs - next available.   Take the albuterol rescue inhaler every 4 to 6 hours as needed for wheezing or shortness of breath. You can also take it 15 minutes before exercise or exertional activity. Side effects include heart racing or pounding, jitters or anxiety. If you have a history of an irregular heart rhythm, it can make this worse. Can also give some patients a hard time sleeping.  Start taking flovent inhaler 2 puffs twice a day - gargle after use - you should take this when you feel yourself getting ill or getting wheezy like you are about to have a flare. Continue at least 5 days after feeling better from URI to prevent flare.   Call me if we need to see you sooner.   By learning about asthma and how it can be controlled, you take an important step toward managing this disease. Work closely with your asthma care team to learn all you can about your asthma, how to avoid triggers, what your medications do, and how to take them correctly. With proper care, you can live free of asthma symptoms and maintain a normal, healthy lifestyle.   What is asthma? Asthma is a chronic disease that affects the airways of the lungs. During normal breathing, the bands of muscle that surround the airways are relaxed and air moves freely. During an asthma episode or "attack," there are three main changes that stop air from moving easily through the airways: The bands of muscle that surround the airways tighten and make the airways narrow. This tightening is called bronchospasm.  The lining of the airways becomes swollen or inflamed.  The cells that line the airways produce more mucus, which is thicker than normal and clogs the airways.  These three factors - bronchospasm, inflammation, and  mucus production - cause symptoms such as difficulty breathing, wheezing, and coughing.  What are the most common symptoms of asthma? Asthma symptoms are not the same for everyone. They can even change from episode to episode in the same person. Also, you may have only one symptom of asthma, such as cough, but another person may have all the symptoms of asthma. It is important to know all the symptoms of asthma and to be aware that your asthma can present in any of these ways at any time. The most common symptoms include: Coughing, especially at night  Shortness of breath  Wheezing  Chest tightness, pain, or pressure   Who is affected by asthma? Asthma affects 22 million Americans; about 6 million of these are children under age 63. People who have a family history of asthma have an increased risk of developing the disease. Asthma is also more common in people who have allergies or who are exposed to tobacco smoke. However, anyone can develop asthma at any time. Some people may have asthma all of their lives, while others may develop it as adults.  What causes asthma? The airways in a person with asthma are very sensitive and react to many things, or "triggers." Contact with these triggers causes asthma symptoms. One of the most important parts of asthma control is to identify your triggers and then avoid them when possible. The only trigger you do not want to avoid is exercise.  Pre-treatment with medicines before exercise can allow you to stay active yet avoid asthma symptoms. Common asthma triggers include: Infections (colds, viruses, flu, sinus infections)  Exercise  Weather (changes in temperature and/or humidity, cold air)  Tobacco smoke  Allergens (dust mites, pollens, pets, mold spores, cockroaches, and sometimes foods)  Irritants (strong odors from cleaning products, perfume, wood smoke, air pollution)  Strong emotions such as crying or laughing hard  Some medications   How is asthma  diagnosed? To diagnose asthma, your doctor will first review your medical history, family history, and symptoms. Your doctor will want to know any past history of breathing problems you may have had, as well as a family history of asthma, allergies, eczema (a bumpy, itchy skin rash caused by allergies), or other lung disease. It is important that you describe your symptoms in detail (cough, wheeze, shortness of breath, chest tightness), including when and how often they occur. The doctor will perform a physical examination and listen to your heart and lungs. He or she may also order breathing tests, allergy tests, blood tests, and chest and sinus X-rays. The tests will find out if you do have asthma and if there are any other conditions that are contributing factors.  How is asthma treated? Asthma can be controlled, but not cured. It is not normal to have frequent symptoms, trouble sleeping, or trouble completing tasks. Appropriate asthma care will prevent symptoms and visits to the emergency room and hospital. Asthma medicines are one of the mainstays of asthma treatment. The drugs used to treat asthma are explained below.  Anti-inflammatories: These are the most important drugs for most people with asthma. Anti-inflammatory drugs reduce swelling and mucus production in the airways. As a result, airways are less sensitive and less likely to react to triggers. These medications need to be taken daily and may need to be taken for several weeks before they begin to control asthma. Anti-inflammatory medicines lead to fewer symptoms, better airflow, less sensitive airways, less airway damage, and fewer asthma attacks. If taken every day, they CONTROL or prevent asthma symptoms.   Bronchodilators: These drugs relax the muscle bands that tighten around the airways. This action opens the airways, letting more air in and out of the lungs and improving breathing. Bronchodilators also help clear mucus from the lungs.  As the airways open, the mucus moves more freely and can be coughed out more easily. In short-acting forms, bronchodilators RELIEVE or stop asthma symptoms by quickly opening the airways and are very helpful during an asthma episode. In long-acting forms, bronchodilators provide CONTROL of asthma symptoms and prevent asthma episodes.  Asthma drugs can be taken in a variety of ways. Inhaling the medications by using a metered dose inhaler, dry powder inhaler, or nebulizer is one way of taking asthma medicines. Oral medicines (pills or liquids you swallow) may also be prescribed.  Asthma severity Asthma is classified as either "intermittent" (comes and goes) or "persistent" (lasting). Persistent asthma is further described as being mild, moderate, or severe. The severity of asthma is based on how often you have symptoms both during the day and night, as well as by the results of lung function tests and by how well you can perform activities. The "severity" of asthma refers to how "intense" or "strong" your asthma is.  Asthma control Asthma control is the goal of asthma treatment. Regardless of your asthma severity, it may or may not be controlled. Asthma control means: You are able to do everything you want  to do at work and home  You have no (or minimal) asthma symptoms  You do not wake up from your sleep or earlier than usual in the morning due to asthma  You rarely need to use your reliever medicine (inhaler)  Another major part of your treatment is that you are happy with your asthma care and believe your asthma is controlled.  Monitoring symptoms A key part of treatment is keeping track of how well your lungs are working. Monitoring your symptoms, what they are, how and when they happen, and how severe they are, is an important part of being able to control your asthma.  Sometimes asthma is monitored using a peak flow meter. A peak flow (PF) meter measures how fast the air comes out of your lungs.  It can help you know when your asthma is getting worse, sometimes even before you have symptoms. By taking daily peak flow readings, you can learn when to adjust medications to keep asthma under good control. It is also used to create your asthma action plan (see below). Your doctor can use your peak flow readings to adjust your treatment plan in some cases.  Asthma Action Plan Based on your history and asthma severity, you and your doctor will develop a care plan called an "asthma action plan." The asthma action plan describes when and how to use your medicines, actions to take when asthma worsens, and when to seek emergency care. Make sure you understand this plan. If you do not, ask your asthma care provider any questions you may have. Your asthma action plan is one of the keys to controlling asthma. Keep it readily available to remind you of what you need to do every day to control asthma and what you need to do when symptoms occur.  Goals of asthma therapy These are the goals of asthma treatment: Live an active, normal life  Prevent chronic and troublesome symptoms  Attend work or school every day  Perform daily activities without difficulty  Stop urgent visits to the doctor, emergency department, or hospital  Use and adjust medications to control asthma with few or no side effects

## 2022-02-27 NOTE — Progress Notes (Signed)
Angela Huff    [id number]    14-Aug-1966  Primary Care Physician:Ross, Dwyane Luo, MD  Referring Physician: Lawerance Cruel, MD [place],  [place] [id number] Reason for Consultation: abnormal ct chest Date of Consultation: 02/27/2022  Chief complaint:   Chief Complaint  Patient presents with   Consult    Pt consult, here after results from a cardiac scoring CT, pt does report having mild asthma.     HPI: Angela Huff a is 56 y.o. woman here for new patient evaluation with history of mild asthma diagnosed in adulthood. She did have wheezing as a kid but was never diagnosed. Triggers include URIs, seasonal allergies. Also has intermittent allergic rhinitis. Usually with dust or trigger exposure.   About a month ago had URI. Usually takes albuterol inhaler very rarely. Taking 3-4 times/day. Sick contacts after travel from [place] with family - they all passed around the URI.   Never been prescribed prednisone for breathing.  Has reflux and takes prilosec - if she eats less and doesn't eat too late her reflux is well controlled.   Does occasionally get wheezy but it is relieved by albuterol.   Social history:  Occupation: SAHM Exposures: lives at home with son and daughter. Husband no pets.  Smoking history: never smoker, no passive smoke exposure.   Social History   Occupational History   Not on file  Tobacco Use   Smoking status: Never   Smokeless tobacco: Never  Substance and Sexual Activity   Alcohol use: Not on file   Drug use: Not on file   Sexual activity: Not on file    Relevant family history:  Family History  Problem Relation Age of Onset   Asthma Mother    Skin cancer Father 41       d. 1   Kidney cancer Brother    Drug abuse Brother        d. 65   Breast cancer Maternal Grandmother 32       d. 59   Stomach cancer Paternal Grandmother 68   Skin cancer Paternal Grandmother    Heart Problems Paternal Uncle     Breast cancer Maternal Great-grandmother 86       MGF's mother    Past Medical History:  Diagnosis Date   Family history of breast cancer    Family history of kidney cancer    Family history of stomach cancer     Past Surgical History:  Procedure Laterality Date   MASTECTOMY Bilateral 2020   breast cancer     Physical Exam: Blood pressure 110/80, pulse (!) 102, height '5\' 7"'$  (1.702 m), weight 171 lb 3.2 oz (77.7 kg), SpO2 99 %. Gen:      No acute distress ENT:  no nasal polyps, mucus membranes moist Lungs:    No increased respiratory effort, symmetric chest wall excursion, clear to auscultation bilaterally, no wheezes or crackles CV:         Regular rate and rhythm; no murmurs, rubs, or gallops.  No pedal edema Abd:      + bowel sounds; soft, non-tender; no distension MSK: no acute synovitis of DIP or PIP joints, no mechanics hands.  Skin:      Warm and dry; no rashes Neuro: normal speech, no focal facial asymmetry Psych: alert and oriented x3, normal mood and affect   Data Reviewed/Medical Decision Making:  Independent interpretation of tests: Imaging:  Review of  patient's ct cardiac images revealed mild peribronchial thickening. Trace RLL scar. The patient's images have been independently reviewed by me.    PFTs:  Labs:  Lab Results  Component Value Date   WBC 8.3 11/12/2021   HGB 13.8 11/12/2021   HCT 39.8 11/12/2021   MCV 87.3 11/12/2021   PLT 299 11/12/2021   Lab Results  Component Value Date   NA 141 11/12/2021   K 3.4 (L) 11/12/2021   CL 106 11/12/2021   CO2 28 11/12/2021     Immunization status:  Immunization History  Administered Date(s) Administered   PFIZER(Purple Top)SARS-COV-2 Vaccination 12/23/2019, 01/16/2020     I reviewed prior external note(s) from cardiology, pcp  I reviewed the result(s) of the labs and imaging as noted above.   I have ordered PFT   Assessment:  Mild intermittent asthma with recent exacerbation RLL  scarring  Plan/Recommendations: Will obtain PFTs given frequent wheezing.  For now can trial prn ICS for exacerbation prevention. Continue albuterol as needed.  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   We discussed disease management and progression at length today.    Return to Care: Return in about 1 year (around 02/28/2023).  Lenice Llamas, MD [facility] [place]  CC: Lawerance Cruel, MD

## 2022-03-02 ENCOUNTER — Other Ambulatory Visit: Payer: Self-pay | Admitting: *Deleted

## 2022-03-02 MED ORDER — FLUTICASONE PROPIONATE HFA 44 MCG/ACT IN AERO: 2.0000 | INHALATION_SPRAY | Freq: Two times a day (BID) | RESPIRATORY_TRACT | 11 refills | Status: DC

## 2022-03-17 ENCOUNTER — Other Ambulatory Visit (HOSPITAL_COMMUNITY): Payer: Self-pay

## 2022-03-17 ENCOUNTER — Telehealth: Payer: Self-pay

## 2022-03-17 NOTE — Telephone Encounter (Signed)
Patient Advocate Encounter   Received notification from [employer] that prior authorization is required for Flovent [facility] 69mg/act  Submitted: 03/17/2022 Key [id number] Status is pending  SClista Bernhardt CPhT Rx Patient Advocate Specialist Phone: [phone]

## 2022-03-17 NOTE — Telephone Encounter (Signed)
Dr. Shearon Stalls please advise.

## 2022-03-17 NOTE — Telephone Encounter (Signed)
Patient Advocate Encounter  Received a fax regarding Prior Authorization from [facility] [employer] for Flovent HFA.   Authorization has been DENIED due to criteria not met. This plan requires the patient to try/fail Pulmicort Flexhaler.  Clista Bernhardt, CPhT Rx Patient Advocate Specialist Phone: [phone]

## 2022-03-18 MED ORDER — PULMICORT FLEXHALER 90 MCG/ACT IN AEPB
1.0000 | INHALATION_SPRAY | Freq: Two times a day (BID) | RESPIRATORY_TRACT | 5 refills | Status: AC
Start: 2022-03-18 — End: ?

## 2022-03-18 NOTE — Telephone Encounter (Signed)
No need for PA - can send pulmicort flexihaler instead, same dose.

## 2022-03-18 NOTE — Addendum Note (Signed)
Addended by: Lenice Llamas on: 03/18/2022 10:50 AM   Modules accepted: Orders

## 2022-03-18 NOTE — Telephone Encounter (Signed)
What dose of the Pulmicort Flexihaler would you like for [place] to send Dr Shearon Stalls?  Please advise

## 2022-03-30 ENCOUNTER — Ambulatory Visit (INDEPENDENT_AMBULATORY_CARE_PROVIDER_SITE_OTHER): Payer: 59 | Admitting: Internal Medicine

## 2022-03-30 DIAGNOSIS — J45909 Unspecified asthma, uncomplicated: Secondary | ICD-10-CM | POA: Diagnosis not present

## 2022-03-30 LAB — PULMONARY FUNCTION TEST
DL/VA % pred: 94 %
DL/VA: 3.9 ml/min/mmHg/L
DLCO cor % pred: 94 %
DLCO cor: 22.31 ml/min/mmHg
DLCO unc % pred: 94 %
DLCO unc: 22.31 ml/min/mmHg
FEF 25-75 Post: 2.64 L/sec
FEF 25-75 Pre: 2.31 L/sec
FEF2575-%Change-Post: 14 %
FEF2575-%Pred-Post: 94 %
FEF2575-%Pred-Pre: 82 %
FEV1-%Change-Post: 3 %
FEV1-%Pred-Post: 94 %
FEV1-%Pred-Pre: 91 %
FEV1-Post: 2.92 L
FEV1-Pre: 2.82 L
FEV1FVC-%Change-Post: -8 %
FEV1FVC-%Pred-Pre: 94 %
FEV6-%Change-Post: 13 %
FEV6-%Pred-Post: 109 %
FEV6-%Pred-Pre: 96 %
FEV6-Post: 4.19 L
FEV6-Pre: 3.69 L
FEV6FVC-%Change-Post: 0 %
FEV6FVC-%Pred-Post: 102 %
FEV6FVC-%Pred-Pre: 101 %
FVC-%Change-Post: 12 %
FVC-%Pred-Post: 107 %
FVC-%Pred-Pre: 95 %
FVC-Post: 4.23 L
FVC-Pre: 3.76 L
Post FEV1/FVC ratio: 69 %
Post FEV6/FVC ratio: 99 %
Pre FEV1/FVC ratio: 75 %
Pre FEV6/FVC Ratio: 99 %
RV % pred: 92 %
RV: 1.92 L
TLC % pred: 105 %
TLC: 5.96 L

## 2022-03-30 NOTE — Progress Notes (Signed)
Full PFT Performed Today  

## 2022-03-30 NOTE — Patient Instructions (Signed)
Full PFT Performed Today  

## 2022-04-28 ENCOUNTER — Other Ambulatory Visit: Payer: Self-pay | Admitting: *Deleted

## 2022-04-28 MED ORDER — TAMOXIFEN CITRATE 20 MG PO TABS: 20.0000 mg | ORAL_TABLET | Freq: Every day | ORAL | 3 refills | Status: DC

## 2022-08-17 DIAGNOSIS — M722 Plantar fascial fibromatosis: Secondary | ICD-10-CM

## 2022-08-17 HISTORY — DX: Plantar fascial fibromatosis: M72.2

## 2022-10-15 ENCOUNTER — Telehealth: Payer: Self-pay | Admitting: Hematology and Oncology

## 2022-10-15 NOTE — Telephone Encounter (Signed)
Per 2/29 IB reached out to check on yearly appointment and lab time. Patient aware.

## 2023-02-05 ENCOUNTER — Telehealth: Payer: Self-pay | Admitting: Hematology and Oncology

## 2023-02-12 ENCOUNTER — Inpatient Hospital Stay: Payer: 59 | Admitting: Hematology and Oncology

## 2023-03-16 ENCOUNTER — Other Ambulatory Visit: Payer: Self-pay | Admitting: Hematology and Oncology

## 2023-04-06 ENCOUNTER — Inpatient Hospital Stay: Payer: 59 | Attending: Hematology and Oncology | Admitting: Hematology and Oncology

## 2023-04-06 ENCOUNTER — Telehealth: Payer: Self-pay | Admitting: Hematology and Oncology

## 2023-04-06 NOTE — Telephone Encounter (Signed)
Called twice; left patient a message in regards to rescheduling appointment

## 2023-04-22 ENCOUNTER — Telehealth: Payer: Self-pay | Admitting: Hematology and Oncology

## 2023-04-28 ENCOUNTER — Other Ambulatory Visit: Payer: Self-pay | Admitting: Hematology and Oncology

## 2023-04-28 ENCOUNTER — Inpatient Hospital Stay: Payer: 59 | Attending: Hematology and Oncology | Admitting: Hematology and Oncology

## 2023-04-28 ENCOUNTER — Inpatient Hospital Stay: Payer: 59

## 2023-04-28 ENCOUNTER — Encounter: Payer: Self-pay | Admitting: Hematology and Oncology

## 2023-04-28 VITALS — BP 114/78 | HR 113 | Temp 97.9°F | Resp 16 | Wt 181.9 lb

## 2023-04-28 DIAGNOSIS — Z803 Family history of malignant neoplasm of breast: Secondary | ICD-10-CM | POA: Diagnosis not present

## 2023-04-28 DIAGNOSIS — C50411 Malignant neoplasm of upper-outer quadrant of right female breast: Secondary | ICD-10-CM | POA: Insufficient documentation

## 2023-04-28 DIAGNOSIS — Z170 Estrogen receptor positive status [ER+]: Secondary | ICD-10-CM

## 2023-04-28 DIAGNOSIS — Z800 Family history of malignant neoplasm of digestive organs: Secondary | ICD-10-CM | POA: Diagnosis not present

## 2023-04-28 DIAGNOSIS — N8500 Endometrial hyperplasia, unspecified: Secondary | ICD-10-CM | POA: Diagnosis not present

## 2023-04-28 DIAGNOSIS — D0511 Intraductal carcinoma in situ of right breast: Secondary | ICD-10-CM

## 2023-04-28 DIAGNOSIS — Z79810 Long term (current) use of selective estrogen receptor modulators (SERMs): Secondary | ICD-10-CM | POA: Diagnosis not present

## 2023-04-28 DIAGNOSIS — Z9011 Acquired absence of right breast and nipple: Secondary | ICD-10-CM | POA: Diagnosis not present

## 2023-04-28 DIAGNOSIS — Z8051 Family history of malignant neoplasm of kidney: Secondary | ICD-10-CM | POA: Diagnosis not present

## 2023-04-28 DIAGNOSIS — R3915 Urgency of urination: Secondary | ICD-10-CM | POA: Insufficient documentation

## 2023-04-28 LAB — CBC WITH DIFFERENTIAL (CANCER CENTER ONLY)
Abs Immature Granulocytes: 0.03 10*3/uL (ref 0.00–0.07)
Basophils Absolute: 0.1 10*3/uL (ref 0.0–0.1)
Basophils Relative: 1 %
Eosinophils Absolute: 0.2 10*3/uL (ref 0.0–0.5)
Eosinophils Relative: 2 %
HCT: 41.4 % (ref 36.0–46.0)
Hemoglobin: 14.7 g/dL (ref 12.0–15.0)
Immature Granulocytes: 0 %
Lymphocytes Relative: 34 %
Lymphs Abs: 3.2 10*3/uL (ref 0.7–4.0)
MCH: 31.3 pg (ref 26.0–34.0)
MCHC: 35.5 g/dL (ref 30.0–36.0)
MCV: 88.1 fL (ref 80.0–100.0)
Monocytes Absolute: 0.7 10*3/uL (ref 0.1–1.0)
Monocytes Relative: 8 %
Neutro Abs: 5 10*3/uL (ref 1.7–7.7)
Neutrophils Relative %: 55 %
Platelet Count: 300 10*3/uL (ref 150–400)
RBC: 4.7 MIL/uL (ref 3.87–5.11)
RDW: 12.1 % (ref 11.5–15.5)
WBC Count: 9.2 10*3/uL (ref 4.0–10.5)
nRBC: 0 % (ref 0.0–0.2)

## 2023-04-28 LAB — CMP (CANCER CENTER ONLY)
ALT: 17 U/L (ref 0–44)
AST: 20 U/L (ref 15–41)
Albumin: 3.9 g/dL (ref 3.5–5.0)
Alkaline Phosphatase: 54 U/L (ref 38–126)
Anion gap: 6 (ref 5–15)
BUN: 13 mg/dL (ref 6–20)
CO2: 26 mmol/L (ref 22–32)
Calcium: 9.3 mg/dL (ref 8.9–10.3)
Chloride: 107 mmol/L (ref 98–111)
Creatinine: 0.9 mg/dL (ref 0.44–1.00)
GFR, Estimated: >60 mL/min (ref >60)
Glucose, Bld: 109 mg/dL — ABNORMAL HIGH (ref 70–99)
Potassium: 3.9 mmol/L (ref 3.5–5.1)
Sodium: 139 mmol/L (ref 135–145)
Total Bilirubin: 0.4 mg/dL (ref 0.3–1.2)
Total Protein: 6.8 g/dL (ref 6.5–8.1)

## 2023-04-28 NOTE — Progress Notes (Signed)
[facility]  Telephone:([phone] Fax:([phone]    ID: Angela Huff DOB: [birth year]  MR#: [id number]  GMW#:[id number]  Patient Care Team: Pershing Proud, RN as Oncology Nurse Navigator Donnelly Angelica, RN as Oncology Nurse Navigator Magrinat, Valentino Hue, MD (Inactive) as Consulting Physician (Oncology) Almond Lint, MD as Consulting Physician (General Surgery) Huel Cote, MD as Consulting Physician (Obstetrics and Gynecology) Nita Sells, MD (Dermatology) Darryl Lent, MD as Referring Physician (Surgical Oncology) Celene Kras, MD (Plastic Surgery) Kimmick, Bonnetta Barry, MD as Referring Physician (Oncology) OTHER MD:   CHIEF COMPLAINT: 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  CASR c.1211T>G VUS identified.  The Multi-Gene Panel offered by [facility] includes sequencing and/or deletion duplication testing of the following 85 genes: AIP, ALK, APC, ATM, AXIN2,BAP1,  BARD1, BLM, BMPR1A, BRCA1, BRCA2, BRIP1, CASR, CDC73, CDH1, CDK4, CDKN1B, CDKN1C, CDKN2A (p14ARF), CDKN2A (p16INK4a), CEBPA, CHEK2, CTNNA1, DICER1, DIS3L2, EGFR (c.2369C>T, p.Thr790Met variant only), EPCAM (Deletion/duplication testing only), FH, FLCN, GATA2, GPC3, GREM1 (Promoter region deletion/duplication testing only), HOXB13 (c.251G>A, p.Gly84Glu), HRAS, KIT, MAX, MEN1, MET, MITF (c.952G>A, p.Glu318Lys variant only), MLH1, MSH2, MSH3, MSH6, MUTYH, NBN,  NF1, NF2, NTHL1, PALB2, PDGFRA, PHOX2B, PMS2, POLD1, POLE, POT1, PRKAR1A, PTCH1, PTEN, RAD50, RAD51C, RAD51D, RB1, RECQL4, RET, RNF43, RUNX1, SDHAF2, SDHA (sequence changes only), SDHB, SDHC, SDHD, SMAD4, SMARCA4, SMARCB1, SMARCE1, STK11, SUFU, TERC, TERT, TMEM127, TP53, TSC1, TSC2, VHL, WRN and WT1.  The report date is December 05, 2018.   11/2018 - 11/2023 Anti-estrogen oral therapy   Tamoxifen. Was stopped perioperatively and resumed on 04/18/2019.   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 ER+, PR+, HER2-.     INTERVAL HISTORY:   She is here for a follow up.  Since her last visit, she says she has been continuing to follow-up with gynecology, unfortunately her gynecologist is struggling with breast cancer and she is now set up to see Dr. Jackelyn Knife.  She had an endometrial ultrasound which showed increased thickness of the endometrium measuring at 18 mm now, last visit was 5.99 mm.  She also has 3 fibroids which have been causing her a lot of trouble.  She consistently notices an urge to urinate and abdominal bloating.  She is hoping that she can talk to Dr. Jackelyn Knife about hysterectomy.  Other than that she has also noticed an episode where she lost a tremendous amount of hair and she was very worried but this has resolved by itself.  She denies any other major medical issues today.  She had her mammogram at [facility] and she is scheduled for her next 1.  Rest of the pertinent 10 point ROS reviewed and negative.   COVID 19 VACCINATION STATUS: Pfizer x2, most recently 01/2020   PAST MEDICAL HISTORY: Past Medical  History:  Diagnosis Date   Family history of breast cancer    Family history of kidney cancer    Family history of stomach  cancer   Mild allergic asthma   PAST SURGICAL HISTORY: Past Surgical History:  Procedure Laterality Date   MASTECTOMY Bilateral 2020   breast cancer   Schwannoma removal left facial nerve area 2008 Placement platinum weight left upper lid   FAMILY HISTORY: Family History  Problem Relation Age of Onset   Asthma Mother    Skin cancer Father 29       d. 65   Kidney cancer Brother    Drug abuse Brother        d. 48   Breast cancer Maternal Grandmother 32       d. 49   Stomach cancer Paternal Grandmother 5   Skin cancer Paternal Grandmother    Heart Problems Paternal Uncle    Breast cancer Maternal Great-grandmother 41       MGF's mother  The patient's father died from slowly but relentlessly invasive basal cell carcinoma at the age of 52.  The patient's mother passed away unexpectedly in 2019/01/18 at age 56.  The patient has 1 brother who died at the age of 101 from a drug overdose, 2 older brothers, and 1 sister, with no history of cancer in the immediate family.  The patient's mother's mother was diagnosed with breast cancer at age 62 and died at age 77.  That grandmother had brothers, no sisters.    GYNECOLOGIC HISTORY:  No LMP recorded. 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  Her husband Angela Huff") works in Print production planner for [employer].  Their children are Angela Huff, 25, who is teaching [place] in [place]; Angela Huff, 23, who is home, and his son Angela Huff 17 who is home.   ADVANCED DIRECTIVES: The patient's husband is her healthcare power of attorney   HEALTH MAINTENANCE: Social History   Tobacco Use   Smoking status: Never   Smokeless tobacco: Never    Colonoscopy: [facility], 2019  PAP:  Up-to-date/[facility]  Bone density: Never  No Known Allergies  Current Outpatient Medications  Medication Sig Dispense Refill   albuterol (PROVENTIL HFA;VENTOLIN HFA) 108 (90 Base) MCG/ACT inhaler albuterol sulfate HFA 90 mcg/actuation aerosol inhaler  INHALE 1 TO 2 PUFFS BY MOUTH EVERY 4 HOURS AS NEEDED     Budesonide (PULMICORT FLEXHALER) 90 MCG/ACT inhaler Inhale 1 puff into the lungs 2 (two) times daily. 1 each 5   clotrimazole-betamethasone (LOTRISONE) cream clotrimazole-betamethasone 1 %-0.05 % topical cream  APPLY TO THE AFFECTED AND SURROUNDING AREAS OF SKIN BY TOPICAL ROUTE 2 TIMES PER DAY IN THE MORNING AND EVENING FOR 2 WEEKS     hydrochlorothiazide (MICROZIDE) 12.5 MG capsule hydrochlorothiazide 12.5 mg capsule     omeprazole (PRILOSEC OTC) 20 MG tablet Take by mouth.     prochlorperazine (COMPAZINE) 5 MG tablet Take 2 tablets (10 mg total) by mouth every 8 (eight) hours as needed for nausea or vomiting. 60 tablet 1   tamoxifen (NOLVADEX) 20 MG tablet TAKE 1 TABLET DAILY 90 tablet 3   No current facility-administered medications for this visit.     OBJECTIVE:  Vitals:   04/28/23 1534  BP: 114/78  Pulse: (!) 113  Resp: 16  Temp: 97.9 F (36.6 C)  SpO2: 96%     Body mass index is 28.49 kg/m.   Wt Readings from Last 3 Encounters:  04/28/23 181 lb 14.4 oz (82.5 kg)  02/27/22 171 lb 3.2 oz (77.7 kg)  02/10/22 176 lb 4 oz (79.9 kg)      ECOG FS: Physical Exam Constitutional:      Appearance: Normal appearance.  Cardiovascular:     Rate and Rhythm: Normal rate and regular rhythm.     Pulses: Normal pulses.     Heart sounds: Normal heart sounds.  Pulmonary:     Effort: Pulmonary effort is normal.     Breath sounds: Normal breath sounds.  Chest:     Comments: She is status post right mastectomy with reconstruction.  Left breast normal to inspection and palpation.  No evidence of local recurrence in the right breast.  No regional adenopathy. Abdominal:      General: Abdomen is flat.     Palpations: Abdomen is soft.  Musculoskeletal:     Cervical back: Normal range of motion and neck supple. No rigidity.  Lymphadenopathy:     Cervical: No cervical adenopathy.  Skin:    General: Skin is warm and dry.  Neurological:     General: No focal deficit present.     Mental Status: She is alert.  Psychiatric:        Mood and Affect: Mood normal.        Behavior: Behavior normal.       LAB RESULTS:  CMP     Component Value Date/Time   NA 141 11/12/2021 1257   K 3.4 (L) 11/12/2021 1257   CL 106 11/12/2021 1257   CO2 28 11/12/2021 1257   GLUCOSE 117 (H) 11/12/2021 1257   BUN 10 11/12/2021 1257   CREATININE 0.82 11/12/2021 1257   CALCIUM 8.9 11/12/2021 1257   PROT 6.5 11/12/2021 1257   ALBUMIN 3.7 11/12/2021 1257   AST 14 (L) 11/12/2021 1257   ALT 13 11/12/2021 1257   ALKPHOS 49 11/12/2021 1257   BILITOT 0.4 11/12/2021 1257   GFRNONAA >60 11/12/2021 1257   GFRAA >60 10/02/2019 0847    No results found for: "TOTALPROTELP", "ALBUMINELP", "A1GS", "A2GS", "BETS", "BETA2SER", "GAMS", "MSPIKE", "SPEI"  No results found for: "KPAFRELGTCHN", "LAMBDASER", "KAPLAMBRATIO"  Lab Results  Component Value Date   WBC 9.2 04/28/2023   NEUTROABS 5.0 04/28/2023   HGB 14.7 04/28/2023   HCT 41.4 04/28/2023   MCV 88.1 04/28/2023   PLT 300 04/28/2023   No results found for: "LABCA2"  No components found for: "ZOXWRU045"  No results for input(s): "INR" in the last 168 hours.  No results found for: "LABCA2"  No results found for: "WUJ811"  No results found for: "CAN125"  No results found for: "CAN153"  No results found for: "CA2729"  No components found for: "HGQUANT"  No results found for: "CEA1", "CEA" / No results found for: "CEA1", "CEA"   No results found for: "AFPTUMOR"  No results found for: "CHROMOGRNA"  No results found for: "HGBA", "HGBA2QUANT", "HGBFQUANT", "HGBSQUAN" (Hemoglobinopathy evaluation)   No results found  for: "LDH"  No results found for: "IRON", "TIBC", "IRONPCTSAT" (Iron and TIBC)  No results found for: "FERRITIN"  Urinalysis No results found for: "COLORURINE", "APPEARANCEUR", "LABSPEC", "PHURINE", "GLUCOSEU", "HGBUR", "BILIRUBINUR", "KETONESUR", "PROTEINUR", "UROBILINOGEN", "NITRITE", "LEUKOCYTESUR"   STUDIES:  No results found.   ELIGIBLE FOR  AVAILABLE RESEARCH PROTOCOL: NO   ASSESSMENT: 56 y.[place]. [place], [place] woman status post right breast upper outer quadrant biopsy 11/15/2018 showing ductal carcinoma in situ, grade 2, strongly estrogen and progesterone receptor positive  (a) biopsy of a lower inner quadrant area of calcifications 11/15/2018 showed flat epithelial atypia  (b) biopsy of right breast lower inner and upper outer quadrants 12/09/2018 both showed atypical ductal hyperplasia status post right mastectomy and sentinel lymph node sampling 03/07/2019 for a pT1a pN0, stage IA invasive mucinous carcinoma, grade 2 with negative margins, prognostic panel not obtained status post immediate stacked DIEP reconstruction, started on Tamoxifen 11/28/2018, resumed 04/18/2019, stopped perioperatively, no indication for adjuvant radiation who is here for follow up on Tamoxifen.  PLAN:  Patient is currently on tamoxifen for adjuvant antiestrogen therapy and tolerating it well.   She will complete 5 years of antiestrogen therapy in summer 2025.  Her most recent transvaginal ultrasound showed increased atrial thickening now measuring at 18 mm as well as 3 fibroids.  At this point I have discussed that tamoxifen might continue to worsen her endometrial hyperplasia as well as fibroid growth.  Since we are not sure if she has gone through menopause, I have suggested considering ovarian suppression along with tamoxifen if she indeed wants to continue tamoxifen for total of 5 years.  She is also proposed if she could come off of it at 4 years.  Then she also wondered if she can get to hysterectomy and  she will keep [place] posted about her preference once she discusses with the surgeon about hysterectomy. Last mammogram in October 2023, no mammographic evidence of malignancy.  Total time spent: 30 minutes.  *Total Encounter Time as defined by the Centers for Medicare and Medicaid Services includes, in addition to the face-to-face time of a patient visit (documented in the note above) non-face-to-face time: obtaining and reviewing outside history, ordering and reviewing medications, tests or procedures, care coordination (communications with other health care professionals or caregivers) and documentation in the medical record.

## 2023-04-29 ENCOUNTER — Encounter: Payer: Self-pay | Admitting: *Deleted

## 2023-04-29 LAB — LUTEINIZING HORMONE: LH: 19.8 m[IU]/mL

## 2023-04-29 LAB — ESTRADIOL: Estradiol: 531 pg/mL

## 2023-04-29 LAB — FOLLICLE STIMULATING HORMONE: FSH: 18.8 m[IU]/mL

## 2023-05-21 ENCOUNTER — Encounter (HOSPITAL_BASED_OUTPATIENT_CLINIC_OR_DEPARTMENT_OTHER): Payer: Self-pay | Admitting: Obstetrics and Gynecology

## 2023-05-24 ENCOUNTER — Other Ambulatory Visit: Payer: Self-pay

## 2023-05-24 ENCOUNTER — Encounter (HOSPITAL_BASED_OUTPATIENT_CLINIC_OR_DEPARTMENT_OTHER): Payer: Self-pay | Admitting: Obstetrics and Gynecology

## 2023-05-24 DIAGNOSIS — D251 Intramural leiomyoma of uterus: Secondary | ICD-10-CM | POA: Diagnosis not present

## 2023-05-24 DIAGNOSIS — Z01818 Encounter for other preprocedural examination: Secondary | ICD-10-CM | POA: Diagnosis present

## 2023-05-24 NOTE — Progress Notes (Signed)
Spoke w/ via phone for pre-op interview---Angela Huff needs dos----none       Huff results------05/31/23 Huff appt for cbc, type & screen cmp, ekg COVID test -----patient states asymptomatic no test needed Arrive at -------0530 on Thursday, 06/03/23 NPO after MN NO Solid Food.  Clear liquids from MN until---0430 Med rec completed Medications to take morning of surgery -----Albuterol inhaler prn, Omeprazole, Zofran prn Patient instructed to ask oncologist instructions for taking tamoxifen. Diabetic medication -----n/a Patient instructed no nail polish to be worn day of surgery Patient instructed to bring photo id and insurance card day of surgery Patient aware to have Driver (ride ) / caregiver    for 24 hours after surgery - husband, Angela Huff Patient Special Instructions -----Extended / overnight stay instructions given. Bring albuterol in haler on the day of surgery. Pre-Op special Instructions -----none Patient verbalized understanding of instructions that were given at this phone interview. Patient denies chest pain, sob, fever, cough at the interview.

## 2023-05-24 NOTE — Progress Notes (Signed)
Your procedure is scheduled on Thursday, 06/03/2023.  Report to [facility] AT  5:30 AM.   Call this number if you have problems the morning of surgery  [phone].   OUR ADDRESS IS [address].  WE [facility] LOCATED [facility].  PLEASE BRING YOUR INSURANCE CARD AND PHOTO ID DAY OF SURGERY.  ONLY 2 PEOPLE ARE ALLOWED IN  WAITING  ROOM                                      REMEMBER:  DO NOT EAT FOOD, CANDY GUM OR MINTS  AFTER MIDNIGHT THE NIGHT BEFORE YOUR SURGERY . YOU MAY HAVE CLEAR LIQUIDS FROM MIDNIGHT THE NIGHT BEFORE YOUR SURGERY UNTIL  4:30 AM NO CLEAR LIQUIDS AFTER   4:30 AM DAY OF SURGERY.  YOU MAY  BRUSH YOUR TEETH MORNING OF SURGERY AND RINSE YOUR MOUTH OUT, NO CHEWING GUM CANDY OR MINTS.     CLEAR LIQUID DIET    Allowed      Water                                                                   Coffee and tea, regular and decaf  (NO cream or milk products of any type, may sweeten)                         Carbonated beverages, regular and diet                                    Sports drinks like Gatorade _____________________________________________________________________     TAKE ONLY THESE MEDICATIONS MORNING OF SURGERY: Omeprazole, Zofran if needed, Albuterol inhaler if needed Please bring albuterol inhaler with you on the day of surgery. Please ask oncologist for instructions on taking tamoxifen.                                        DO NOT WEAR JEWERLY/  METAL/  PIERCINGS (INCLUDING NO PLASTIC PIERCINGS) DO NOT WEAR LOTIONS, POWDERS, PERFUMES OR NAIL POLISH ON YOUR FINGERNAILS. TOENAIL POLISH IS OK TO WEAR. DO NOT SHAVE FOR 48 HOURS PRIOR TO DAY OF SURGERY.  CONTACTS, GLASSES, OR DENTURES MAY NOT BE WORN TO SURGERY.  REMEMBER: NO SMOKING, VAPING ,  DRUGS OR ALCOHOL FOR 24 HOURS BEFORE YOUR SURGERY.                                    [facility] IS NOT RESPONSIBLE  FOR ANY BELONGINGS.                                                                     Marland Kitchen  [facility] - Preparing for Surgery Before surgery, you can play an important role.  Because skin is not sterile, your skin needs to be as free of germs as possible.  You can reduce the number of germs on your skin by washing with CHG (chlorahexidine gluconate) soap before surgery.  CHG is an antiseptic cleaner which kills germs and bonds with the skin to continue killing germs even after washing. Please DO NOT use if you have an allergy to CHG or antibacterial soaps.  If your skin becomes reddened/irritated stop using the CHG and inform your nurse when you arrive at Short Stay. Do not shave (including legs and underarms) for at least 48 hours prior to the first CHG shower.  You may shave your face/neck. Please follow these instructions carefully:  1.  Shower with CHG Soap the night before surgery and the  morning of Surgery.  2.  If you choose to wash your hair, wash your hair first as usual with your  normal  shampoo.  3.  After you shampoo, rinse your hair and body thoroughly to remove the  shampoo.                                        4.  Use CHG as you would any other liquid soap.  You can apply chg directly  to the skin and wash , chg soap provided, night before and morning of your surgery.  5.  Apply the CHG Soap to your body ONLY FROM THE NECK DOWN.   Do not use on face/ open                           Wound or open sores. Avoid contact with eyes, ears mouth and genitals (private parts).                       Wash face,  Genitals (private parts) with your normal soap.             6.  Wash thoroughly, paying special attention to the area where your surgery  will be performed.  7.  Thoroughly rinse your body with warm water from the neck down.  8.  DO NOT shower/wash with your normal soap after using and rinsing off  the CHG Soap.             9.  Pat yourself dry with a clean towel.            10.  Wear clean pajamas.            11.   Place clean sheets on your bed the night of your first shower and do not  sleep with pets. Day of Surgery : Do not apply any lotions/ powders the morning of surgery.  Please wear clean clothes to the hospital/surgery center.  IF YOU HAVE ANY SKIN IRRITATION OR PROBLEMS WITH THE SURGICAL SOAP, PLEASE GET A BAR OF GOLD DIAL SOAP AND SHOWER THE NIGHT BEFORE YOUR SURGERY AND THE MORNING OF YOUR SURGERY. PLEASE LET THE NURSE KNOW MORNING OF YOUR SURGERY IF YOU HAD ANY PROBLEMS WITH THE SURGICAL SOAP.   YOUR SURGEON MAY HAVE REQUESTED EXTENDED RECOVERY TIME AFTER YOUR SURGERY. IT COULD BE A  JUST A FEW HOURS  UP TO AN OVERNIGHT STAY.  YOUR SURGEON SHOULD HAVE DISCUSSED  THIS WITH YOU PRIOR TO YOUR SURGERY. IN THE EVENT YOU NEED TO STAY OVERNIGHT PLEASE REFER TO THE FOLLOWING GUIDELINES. YOU MAY HAVE UP TO 4 VISITORS  MAY VISIT IN THE EXTENDED RECOVERY ROOM UNTIL 800 PM ONLY.  ONE  VISITOR AGE 46 AND OVER MAY SPEND THE NIGHT AND MUST BE IN EXTENDED RECOVERY ROOM NO LATER THAN 800 PM . YOUR DISCHARGE TIME AFTER YOU SPEND THE NIGHT IS 900 AM THE MORNING AFTER YOUR SURGERY. YOU MAY PACK A SMALL OVERNIGHT BAG WITH TOILETRIES FOR YOUR OVERNIGHT STAY IF YOU WISH.  REGARDLESS OF IF YOU STAY OVER NIGHT OR ARE DISCHARGED THE SAME DAY YOU WILL BE REQUIRED TO HAVE A RESPONSIBLE ADULT (18 YRS OLD OR OLDER) STAY WITH YOU FOR AT LEAST THE FIRST 24 HOURS  YOUR PRESCRIPTION MEDICATIONS WILL BE PROVIDED DURING YOUR HOSPITAL STAY.  ________________________________________________________________________                                                        QUESTIONS Mechele Claude PRE OP NURSE PHONE [phone].

## 2023-05-28 ENCOUNTER — Telehealth: Payer: Self-pay | Admitting: *Deleted

## 2023-05-28 ENCOUNTER — Telehealth: Payer: Self-pay | Admitting: Hematology and Oncology

## 2023-05-28 NOTE — Telephone Encounter (Signed)
Patient is aware of scheduled appointment times/dates for follow up appointment with [facility]

## 2023-05-28 NOTE — Telephone Encounter (Signed)
This RN spoke with pt per her call stating she is scheduled for hysterectomy with bilateral oophorectomy on 06/03/2023 under Dr Jackelyn Knife..  This RN advised pt to hold her tamoxifen at present and to not restart until she is resuming her normal daily activities post surgery.  Pt was seen in September for her yearly visit.  Per phone discussion pt verbalized understanding of holding tamoxifen per above recommendation.  This note will be sent to MD for review and to see if pt needs to be seen post surgery for discussion of possible benefit with change in therapy.

## 2023-05-31 ENCOUNTER — Encounter (HOSPITAL_COMMUNITY)
Admission: RE | Admit: 2023-05-31 | Discharge: 2023-05-31 | Disposition: A | Discharge status: Home / Self Care | Payer: 59 | Source: Ambulatory Visit | Attending: Obstetrics and Gynecology | Admitting: Obstetrics and Gynecology

## 2023-05-31 ENCOUNTER — Other Ambulatory Visit: Payer: Self-pay

## 2023-05-31 DIAGNOSIS — Z01818 Encounter for other preprocedural examination: Secondary | ICD-10-CM | POA: Insufficient documentation

## 2023-05-31 DIAGNOSIS — D251 Intramural leiomyoma of uterus: Secondary | ICD-10-CM | POA: Insufficient documentation

## 2023-05-31 LAB — COMPREHENSIVE METABOLIC PANEL
ALT: 22 U/L (ref 0–44)
AST: 23 U/L (ref 15–41)
Albumin: 3.6 g/dL (ref 3.5–5.0)
Alkaline Phosphatase: 49 U/L (ref 38–126)
Anion gap: 6 (ref 5–15)
BUN: 9 mg/dL (ref 6–20)
CO2: 24 mmol/L (ref 22–32)
Calcium: 8.5 mg/dL — ABNORMAL LOW (ref 8.9–10.3)
Chloride: 107 mmol/L (ref 98–111)
Creatinine, Ser: 0.79 mg/dL (ref 0.44–1.00)
GFR, Estimated: >60 mL/min (ref >60)
Glucose, Bld: 110 mg/dL — ABNORMAL HIGH (ref 70–99)
Potassium: 3.8 mmol/L (ref 3.5–5.1)
Sodium: 137 mmol/L (ref 135–145)
Total Bilirubin: 0.7 mg/dL (ref 0.3–1.2)
Total Protein: 6.7 g/dL (ref 6.5–8.1)

## 2023-05-31 LAB — CBC
HCT: 42.7 % (ref 36.0–46.0)
Hemoglobin: 14.2 g/dL (ref 12.0–15.0)
MCH: 30.2 pg (ref 26.0–34.0)
MCHC: 33.3 g/dL (ref 30.0–36.0)
MCV: 90.9 fL (ref 80.0–100.0)
Platelets: 285 10*3/uL (ref 150–400)
RBC: 4.7 MIL/uL (ref 3.87–5.11)
RDW: 12.2 % (ref 11.5–15.5)
WBC: 8.1 10*3/uL (ref 4.0–10.5)
nRBC: 0 % (ref 0.0–0.2)

## 2023-06-02 NOTE — H&P (Signed)
Angela Huff is an 57 y.o. female. She was seen in September for annual exam. She has known fibroids with [place] 8/21 showing fibroids (multiple) with 2 in the right adnexal area. She c/o pelvic pain and pressure with it but no bleeding. 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 s/p mastectomy and DIEP flap reconstruction at [facility]. Genetic screen negative. Continues on tamoxifen. She is s/p endometrial ablation with no bleeding in a good while.  Pelvic US at time of this last exam with Rt and Lt ovarian simple cysts, 7 cm anterior Rt lateral LUS pedunculated fibroid - slightly larger than last [place], and 2 smaller fundal myomas up to 4 cm.  Options have been discussed and she wants definitive surgical management.  Pertinent Gynecological History: Last pap: normal Date: 2023 OB History: G3, P3003 SVD x 3   Menstrual History: No LMP recorded. Patient has had an ablation.    Past Medical History:  Diagnosis Date   Anxiety    no meds   Asthma    mild asthma per PFT on 03/30/22 in Epic   Cancer [facility]) 2020   DCIS right breast, Follows w/ [facility].   Depression    no meds   Family history of breast cancer    Family history of kidney cancer    Family history of stomach cancer    Fibroids    GERD (gastroesophageal reflux disease)    History of hiatal hernia    History of palpitations    Pt states this has been going on for twenty-five years. Pt was evaluated by doctor and was told it was nothing to worry about.   Hypertension    Follows w/ [facility].   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   Plantar fasciitis, bilateral 2024   per pt   PONV (postoperative nausea and vomiting)    Ptosis    Wears glasses     Past Surgical History:  Procedure Laterality Date   BREAST RECONSTRUCTION  05/2019   COLONOSCOPY  2019   [facility]    ENDOMETRIAL ABLATION  2015   with polyp removal   EYE SURGERY  02/2022   had a platinum piece removed that helped her blink   MASTECTOMY Right 2020   breast cancer   TUMOR REMOVAL Left 2008   Schwannoma removed per pt    Family History  Problem Relation Age of Onset   Asthma Mother    Skin cancer Father 50       d. 65   Kidney cancer Brother    Drug abuse Brother        d. 38   Breast cancer Maternal Grandmother 32       d. 34   Stomach cancer Paternal Grandmother 34   Skin cancer Paternal Grandmother    Heart Problems Paternal Uncle    Breast cancer Maternal Great-grandmother 54       MGF's mother    Social History:  reports that she has never smoked. She has never used smokeless tobacco. She reports that she does not currently use alcohol. She reports that she does not use drugs.  Allergies:  Allergies  Allergen Reactions   Codeine Nausea And Vomiting    No medications prior to admission.    Review of Systems  Respiratory: Negative.  Cardiovascular: Negative.     Height 5' 7.5" (1.715 m), weight 81.6 kg. Physical Exam Constitutional:      Appearance: Normal appearance.  Cardiovascular:     Rate and Rhythm: Normal rate and regular rhythm.     Heart sounds: Normal heart sounds. No murmur heard. Pulmonary:     Effort: Pulmonary effort is normal. No respiratory distress.     Breath sounds: Normal breath sounds. No wheezing.  Abdominal:     General: There is no distension.     Palpations: Abdomen is soft. There is no mass.     Tenderness: There is no abdominal tenderness.  Genitourinary:    General: Normal vulva.     Comments: Uterus enlarged to 11 weeks size, irregular, tender No adnexal mass Musculoskeletal:     Cervical back: Normal range of motion and neck supple.  Neurological:     Mental Status: She is alert.     No results found for this or any previous visit (from the past 24 hour(s)).  No results found.  Assessment/Plan: Symptomatic  myomatous uterus, personal h/o breast cancer.  Discussed all medical and surgical options, she wants definitive surgical management.  Discussed surgical procedure, risks, alternatives, chances of relieving her symptoms, all questions answered.  Will admit for TLH/BSO, cystoscopy.  Angela Huff 06/02/2023, 7:05 PM

## 2023-06-02 NOTE — Anesthesia Preprocedure Evaluation (Signed)
Anesthesia Evaluation  Patient identified by MRN, date of birth, ID band Patient awake    Reviewed: Allergy & Precautions, H&P , NPO status , Patient's Chart, lab work & pertinent test results  History of Anesthesia Complications (+) PONV and history of anesthetic complications  Airway Mallampati: II  TM Distance: >3 FB Neck ROM: Full    Dental no notable dental hx.    Pulmonary asthma    Pulmonary exam normal breath sounds clear to auscultation       Cardiovascular hypertension, Pt. on medications Normal cardiovascular exam Rhythm:Regular Rate:Normal     Neuro/Psych negative neurological ROS  negative psych ROS   GI/Hepatic Neg liver ROS,GERD  Medicated,,  Endo/Other  negative endocrine ROS    Renal/GU negative Renal ROS  negative genitourinary   Musculoskeletal negative musculoskeletal ROS (+)    Abdominal   Peds negative pediatric ROS (+)  Hematology negative hematology ROS (+)   Anesthesia Other Findings   Reproductive/Obstetrics negative OB ROS                             Anesthesia Physical Anesthesia Plan  ASA: 2  Anesthesia Plan: General   Post-op Pain Management: Toradol IV (intra-op)*   Induction: Intravenous  PONV Risk Score and Plan: 4 or greater and Ondansetron, Dexamethasone, Droperidol, Midazolam, Scopolamine patch - Pre-op and Treatment may vary due to age or medical condition  Airway Management Planned: Oral ETT  Additional Equipment:   Intra-op Plan:   Post-operative Plan: Extubation in OR  Informed Consent: I have reviewed the patients History and Physical, chart, labs and discussed the procedure including the risks, benefits and alternatives for the proposed anesthesia with the patient or authorized representative who has indicated his/her understanding and acceptance.     Dental advisory given  Plan Discussed with: CRNA and Surgeon  Anesthesia  Plan Comments:        Anesthesia Quick Evaluation

## 2023-06-03 ENCOUNTER — Observation Stay (HOSPITAL_BASED_OUTPATIENT_CLINIC_OR_DEPARTMENT_OTHER): Payer: Self-pay | Admitting: Anesthesiology

## 2023-06-03 ENCOUNTER — Observation Stay (HOSPITAL_BASED_OUTPATIENT_CLINIC_OR_DEPARTMENT_OTHER)
Admission: RE | Admit: 2023-06-03 | Discharge: 2023-06-04 | Disposition: A | Discharge status: Home / Self Care | Payer: 59 | Attending: Obstetrics and Gynecology | Admitting: Obstetrics and Gynecology

## 2023-06-03 ENCOUNTER — Other Ambulatory Visit: Payer: Self-pay

## 2023-06-03 ENCOUNTER — Encounter (HOSPITAL_BASED_OUTPATIENT_CLINIC_OR_DEPARTMENT_OTHER): Payer: Self-pay | Admitting: Obstetrics and Gynecology

## 2023-06-03 ENCOUNTER — Encounter (HOSPITAL_BASED_OUTPATIENT_CLINIC_OR_DEPARTMENT_OTHER)
Admission: RE | Disposition: A | Discharge status: Home / Self Care | Payer: Self-pay | Source: Home / Self Care | Attending: Obstetrics and Gynecology

## 2023-06-03 DIAGNOSIS — I10 Essential (primary) hypertension: Secondary | ICD-10-CM | POA: Diagnosis not present

## 2023-06-03 DIAGNOSIS — Z853 Personal history of malignant neoplasm of breast: Secondary | ICD-10-CM | POA: Diagnosis not present

## 2023-06-03 DIAGNOSIS — Z90710 Acquired absence of both cervix and uterus: Secondary | ICD-10-CM | POA: Diagnosis present

## 2023-06-03 DIAGNOSIS — D259 Leiomyoma of uterus, unspecified: Principal | ICD-10-CM | POA: Diagnosis present

## 2023-06-03 DIAGNOSIS — Z01818 Encounter for other preprocedural examination: Secondary | ICD-10-CM

## 2023-06-03 DIAGNOSIS — D251 Intramural leiomyoma of uterus: Principal | ICD-10-CM

## 2023-06-03 HISTORY — DX: Presence of spectacles and contact lenses: Z97.3

## 2023-06-03 HISTORY — DX: Personal history of other diseases of the digestive system: Z87.19

## 2023-06-03 HISTORY — DX: Essential (primary) hypertension: I10

## 2023-06-03 HISTORY — DX: Other specified postprocedural states: Z98.890

## 2023-06-03 HISTORY — DX: Depression, unspecified: F32.A

## 2023-06-03 HISTORY — DX: Personal history of other specified conditions: Z87.898

## 2023-06-03 HISTORY — PX: TOTAL LAPAROSCOPIC HYSTERECTOMY WITH SALPINGECTOMY: SHX6742

## 2023-06-03 HISTORY — DX: Other specified postprocedural states: R11.2

## 2023-06-03 HISTORY — DX: Benign neoplasm of connective and other soft tissue, unspecified: D21.9

## 2023-06-03 HISTORY — DX: Unspecified ovarian cyst, unspecified side: N83.209

## 2023-06-03 HISTORY — DX: Unspecified ptosis of unspecified eyelid: H02.409

## 2023-06-03 HISTORY — DX: Anxiety disorder, unspecified: F41.9

## 2023-06-03 HISTORY — DX: Unspecified asthma, uncomplicated: J45.909

## 2023-06-03 HISTORY — DX: Gastro-esophageal reflux disease without esophagitis: K21.9

## 2023-06-03 HISTORY — PX: CYSTOSCOPY: SHX5120

## 2023-06-03 LAB — POCT I-STAT, CHEM 8
BUN: 12 mg/dL (ref 6–20)
Calcium, Ion: 1.22 mmol/L (ref 1.15–1.40)
Chloride: 104 mmol/L (ref 98–111)
Creatinine, Ser: 0.7 mg/dL (ref 0.44–1.00)
Glucose, Bld: 113 mg/dL — ABNORMAL HIGH (ref 70–99)
HCT: 42 % (ref 36.0–46.0)
Hemoglobin: 14.3 g/dL (ref 12.0–15.0)
Potassium: 3.8 mmol/L (ref 3.5–5.1)
Sodium: 140 mmol/L (ref 135–145)
TCO2: 25 mmol/L (ref 22–32)

## 2023-06-03 LAB — ABO/RH: ABO/RH(D): O NEG

## 2023-06-03 LAB — TYPE AND SCREEN
ABO/RH(D): O NEG
Antibody Screen: NEGATIVE

## 2023-06-03 SURGERY — HYSTERECTOMY, TOTAL, LAPAROSCOPIC, WITH SALPINGECTOMY
Anesthesia: General | Site: Bladder

## 2023-06-03 MED ORDER — ONDANSETRON HCL 4 MG/2ML IJ SOLN: 4.0000 mg | Freq: Four times a day (QID) | INTRAMUSCULAR | Status: DC | PRN

## 2023-06-03 MED ORDER — LIDOCAINE 2% (20 MG/ML) 5 ML SYRINGE
INTRAMUSCULAR | Status: DC | PRN
  Administered 2023-06-03: 60 mg via INTRAVENOUS

## 2023-06-03 MED ORDER — KETOROLAC TROMETHAMINE 0.5 % OP SOLN
1.0000 [drp] | Freq: Four times a day (QID) | OPHTHALMIC | Status: DC
  Administered 2023-06-03 – 2023-06-04 (×4): 1 [drp] via OPHTHALMIC
  Filled 2023-06-03: qty 3

## 2023-06-03 MED ORDER — SCOPOLAMINE 1 MG/3DAYS TD PT72
1.0000 | MEDICATED_PATCH | TRANSDERMAL | Status: DC
  Administered 2023-06-03: 1.5 mg via TRANSDERMAL

## 2023-06-03 MED ORDER — BISACODYL 10 MG RE SUPP: 10.0000 mg | Freq: Every day | RECTAL | Status: DC | PRN

## 2023-06-03 MED ORDER — ACETAMINOPHEN 500 MG PO TABS
ORAL_TABLET | ORAL | Status: AC
  Filled 2023-06-03: qty 2

## 2023-06-03 MED ORDER — LACTATED RINGERS IV SOLN: INTRAVENOUS | Status: DC

## 2023-06-03 MED ORDER — SIMETHICONE 80 MG PO CHEW: 80.0000 mg | CHEWABLE_TABLET | Freq: Four times a day (QID) | ORAL | Status: DC | PRN

## 2023-06-03 MED ORDER — ROCURONIUM BROMIDE 10 MG/ML (PF) SYRINGE
PREFILLED_SYRINGE | INTRAVENOUS | Status: AC
  Filled 2023-06-03: qty 10

## 2023-06-03 MED ORDER — OXYCODONE HCL 5 MG PO TABS
5.0000 mg | ORAL_TABLET | ORAL | Status: DC | PRN
  Administered 2023-06-03 – 2023-06-04 (×2): 5 mg via ORAL
  Administered 2023-06-04 (×2): 10 mg via ORAL

## 2023-06-03 MED ORDER — KETOROLAC TROMETHAMINE 30 MG/ML IJ SOLN
30.0000 mg | Freq: Four times a day (QID) | INTRAMUSCULAR | Status: AC
  Administered 2023-06-03 – 2023-06-04 (×3): 30 mg via INTRAVENOUS

## 2023-06-03 MED ORDER — CEFAZOLIN SODIUM-DEXTROSE 2-4 GM/100ML-% IV SOLN
INTRAVENOUS | Status: AC
  Filled 2023-06-03: qty 100

## 2023-06-03 MED ORDER — ACETAMINOPHEN 500 MG PO TABS
1000.0000 mg | ORAL_TABLET | ORAL | Status: AC
  Administered 2023-06-03: 1000 mg via ORAL

## 2023-06-03 MED ORDER — MIDAZOLAM HCL 2 MG/2ML IJ SOLN
INTRAMUSCULAR | Status: DC | PRN
  Administered 2023-06-03: 2 mg via INTRAVENOUS

## 2023-06-03 MED ORDER — FENTANYL CITRATE (PF) 100 MCG/2ML IJ SOLN
INTRAMUSCULAR | Status: AC
  Filled 2023-06-03: qty 2

## 2023-06-03 MED ORDER — PHENYLEPHRINE 80 MCG/ML (10ML) SYRINGE FOR IV PUSH (FOR BLOOD PRESSURE SUPPORT)
PREFILLED_SYRINGE | INTRAVENOUS | Status: AC
  Filled 2023-06-03: qty 10

## 2023-06-03 MED ORDER — DEXTROSE-SODIUM CHLORIDE 5-0.45 % IV SOLN: INTRAVENOUS | Status: AC

## 2023-06-03 MED ORDER — HYDROMORPHONE HCL 1 MG/ML IJ SOLN: 0.2500 mg | INTRAMUSCULAR | Status: DC | PRN

## 2023-06-03 MED ORDER — ONDANSETRON HCL 4 MG/2ML IJ SOLN: 4.0000 mg | Freq: Once | INTRAMUSCULAR | Status: DC | PRN

## 2023-06-03 MED ORDER — BUPIVACAINE HCL (PF) 0.25 % IJ SOLN
INTRAMUSCULAR | Status: DC | PRN
  Administered 2023-06-03: 15 mL

## 2023-06-03 MED ORDER — BSS IO SOLN
15.0000 mL | Freq: Four times a day (QID) | INTRAOCULAR | Status: DC
  Administered 2023-06-03 – 2023-06-04 (×4): 15 mL via INTRAOCULAR

## 2023-06-03 MED ORDER — GABAPENTIN 300 MG PO CAPS
ORAL_CAPSULE | ORAL | Status: AC
  Filled 2023-06-03: qty 1

## 2023-06-03 MED ORDER — IBUPROFEN 200 MG PO TABS: 600.0000 mg | ORAL_TABLET | Freq: Four times a day (QID) | ORAL | Status: DC

## 2023-06-03 MED ORDER — DEXMEDETOMIDINE HCL IN NACL 80 MCG/20ML IV SOLN
INTRAVENOUS | Status: DC | PRN
Start: 2023-06-03 — End: 2023-06-03
  Administered 2023-06-03 (×2): 8 ug via INTRAVENOUS

## 2023-06-03 MED ORDER — OXYCODONE HCL 5 MG/5ML PO SOLN: 5.0000 mg | Freq: Once | ORAL | Status: DC | PRN

## 2023-06-03 MED ORDER — ALUM & MAG HYDROXIDE-SIMETH 200-200-20 MG/5ML PO SUSP: 30.0000 mL | ORAL | Status: DC | PRN

## 2023-06-03 MED ORDER — SODIUM CHLORIDE 0.9 % IR SOLN
Status: DC | PRN
  Administered 2023-06-03: 1000 mL

## 2023-06-03 MED ORDER — PROPOFOL 10 MG/ML IV BOLUS
INTRAVENOUS | Status: DC | PRN
  Administered 2023-06-03: 150 mg via INTRAVENOUS

## 2023-06-03 MED ORDER — PHENYLEPHRINE 80 MCG/ML (10ML) SYRINGE FOR IV PUSH (FOR BLOOD PRESSURE SUPPORT)
PREFILLED_SYRINGE | INTRAVENOUS | Status: DC | PRN
Start: 2023-06-03 — End: 2023-06-03
  Administered 2023-06-03 (×4): 80 ug via INTRAVENOUS

## 2023-06-03 MED ORDER — DEXAMETHASONE SODIUM PHOSPHATE 10 MG/ML IJ SOLN
INTRAMUSCULAR | Status: AC
  Filled 2023-06-03: qty 1

## 2023-06-03 MED ORDER — FENTANYL CITRATE (PF) 100 MCG/2ML IJ SOLN
INTRAMUSCULAR | Status: DC | PRN
  Administered 2023-06-03 (×3): 50 ug via INTRAVENOUS

## 2023-06-03 MED ORDER — GABAPENTIN 300 MG PO CAPS
300.0000 mg | ORAL_CAPSULE | Freq: Three times a day (TID) | ORAL | Status: DC
  Administered 2023-06-03 (×2): 300 mg via ORAL

## 2023-06-03 MED ORDER — OXYCODONE HCL 5 MG PO TABS: 5.0000 mg | ORAL_TABLET | Freq: Once | ORAL | Status: DC | PRN

## 2023-06-03 MED ORDER — ROCURONIUM BROMIDE 10 MG/ML (PF) SYRINGE
PREFILLED_SYRINGE | INTRAVENOUS | Status: DC | PRN
  Administered 2023-06-03: 50 mg via INTRAVENOUS
  Administered 2023-06-03: 10 mg via INTRAVENOUS

## 2023-06-03 MED ORDER — HYDROMORPHONE HCL 1 MG/ML IJ SOLN: 1.0000 mg | INTRAMUSCULAR | Status: DC | PRN

## 2023-06-03 MED ORDER — DROPERIDOL 2.5 MG/ML IJ SOLN
INTRAMUSCULAR | Status: DC | PRN
Start: 2023-06-03 — End: 2023-06-03
  Administered 2023-06-03: .625 mg via INTRAVENOUS

## 2023-06-03 MED ORDER — ONDANSETRON HCL 4 MG/2ML IJ SOLN
INTRAMUSCULAR | Status: AC
  Filled 2023-06-03: qty 2

## 2023-06-03 MED ORDER — MENTHOL 3 MG MT LOZG: 1.0000 | LOZENGE | OROMUCOSAL | Status: DC | PRN

## 2023-06-03 MED ORDER — ONDANSETRON HCL 4 MG/2ML IJ SOLN
INTRAMUSCULAR | Status: DC | PRN
  Administered 2023-06-03: 4 mg via INTRAVENOUS

## 2023-06-03 MED ORDER — DEXMEDETOMIDINE HCL IN NACL 80 MCG/20ML IV SOLN
INTRAVENOUS | Status: AC
  Filled 2023-06-03: qty 20

## 2023-06-03 MED ORDER — CEFAZOLIN SODIUM-DEXTROSE 2-4 GM/100ML-% IV SOLN
2.0000 g | INTRAVENOUS | Status: AC
  Administered 2023-06-03: 2 g via INTRAVENOUS

## 2023-06-03 MED ORDER — SCOPOLAMINE 1 MG/3DAYS TD PT72
MEDICATED_PATCH | TRANSDERMAL | Status: AC
  Filled 2023-06-03: qty 1

## 2023-06-03 MED ORDER — HYDROCHLOROTHIAZIDE 12.5 MG PO TABS
12.5000 mg | ORAL_TABLET | Freq: Every day | ORAL | Status: DC
  Filled 2023-06-03 (×2): qty 1

## 2023-06-03 MED ORDER — BSS IO SOLN
INTRAOCULAR | Status: AC
  Filled 2023-06-03: qty 15

## 2023-06-03 MED ORDER — OXYCODONE HCL 5 MG PO TABS
ORAL_TABLET | ORAL | Status: AC
  Filled 2023-06-03: qty 1

## 2023-06-03 MED ORDER — PROPOFOL 10 MG/ML IV BOLUS
INTRAVENOUS | Status: AC
  Filled 2023-06-03: qty 20

## 2023-06-03 MED ORDER — BUPIVACAINE HCL 0.5 % IJ SOLN
INTRAMUSCULAR | Status: DC | PRN
  Administered 2023-06-03: 10 mL

## 2023-06-03 MED ORDER — SUGAMMADEX SODIUM 200 MG/2ML IV SOLN
INTRAVENOUS | Status: DC | PRN
  Administered 2023-06-03: 200 mg via INTRAVENOUS

## 2023-06-03 MED ORDER — ALBUTEROL SULFATE HFA 108 (90 BASE) MCG/ACT IN AERS: 2.0000 | INHALATION_SPRAY | Freq: Four times a day (QID) | RESPIRATORY_TRACT | Status: DC | PRN

## 2023-06-03 MED ORDER — GABAPENTIN 300 MG PO CAPS
300.0000 mg | ORAL_CAPSULE | ORAL | Status: AC
  Administered 2023-06-03: 300 mg via ORAL

## 2023-06-03 MED ORDER — KETOROLAC TROMETHAMINE 30 MG/ML IJ SOLN
INTRAMUSCULAR | Status: AC
  Filled 2023-06-03: qty 1

## 2023-06-03 MED ORDER — KETOROLAC TROMETHAMINE 30 MG/ML IJ SOLN
INTRAMUSCULAR | Status: DC | PRN
Start: 2023-06-03 — End: 2023-06-03
  Administered 2023-06-03: 30 mg via INTRAVENOUS

## 2023-06-03 MED ORDER — LIDOCAINE HCL (PF) 2 % IJ SOLN
INTRAMUSCULAR | Status: AC
  Filled 2023-06-03: qty 5

## 2023-06-03 MED ORDER — ONDANSETRON HCL 4 MG PO TABS: 4.0000 mg | ORAL_TABLET | Freq: Four times a day (QID) | ORAL | Status: DC | PRN

## 2023-06-03 MED ORDER — POLYMYXIN B-TRIMETHOPRIM 10000-0.1 UNIT/ML-% OP SOLN
1.0000 [drp] | Freq: Four times a day (QID) | OPHTHALMIC | Status: DC
  Administered 2023-06-03 – 2023-06-04 (×3): 1 [drp] via OPHTHALMIC
  Filled 2023-06-03: qty 10

## 2023-06-03 MED ORDER — DEXAMETHASONE SODIUM PHOSPHATE 10 MG/ML IJ SOLN
INTRAMUSCULAR | Status: DC | PRN
  Administered 2023-06-03 (×2): 10 mg via INTRAVENOUS

## 2023-06-03 MED ORDER — MIDAZOLAM HCL 2 MG/2ML IJ SOLN
INTRAMUSCULAR | Status: AC
  Filled 2023-06-03: qty 2

## 2023-06-03 MED ORDER — ACETAMINOPHEN 500 MG PO TABS
1000.0000 mg | ORAL_TABLET | Freq: Four times a day (QID) | ORAL | Status: DC
  Administered 2023-06-03 – 2023-06-04 (×4): 1000 mg via ORAL

## 2023-06-03 SURGICAL SUPPLY — 55 items
ADH SKN CLS APL DERMABOND .7 (GAUZE/BANDAGES/DRESSINGS) ×2
APL SRG 38 LTWT LNG FL B (MISCELLANEOUS)
APPLICATOR ARISTA FLEXITIP XL (MISCELLANEOUS) IMPLANT
BARRIER ADHS 3X4 INTERCEED (GAUZE/BANDAGES/DRESSINGS) IMPLANT
BRR ADH 4X3 ABS CNTRL BYND (GAUZE/BANDAGES/DRESSINGS)
CABLE HIGH FREQUENCY MONO STRZ (ELECTRODE) IMPLANT
COVER BACK TABLE 60X90IN (DRAPES) IMPLANT
COVER MAYO STAND STRL (DRAPES) ×3 IMPLANT
DERMABOND ADVANCED .7 DNX12 (GAUZE/BANDAGES/DRESSINGS) ×3 IMPLANT
DEVICE SUTURE ENDOST 10MM (ENDOMECHANICALS) IMPLANT
DRAPE SURG IRRIG POUCH 19X23 (DRAPES) ×3 IMPLANT
DRSG OPSITE POSTOP 4X10 (GAUZE/BANDAGES/DRESSINGS) IMPLANT
DURAPREP 26ML APPLICATOR (WOUND CARE) ×3 IMPLANT
GAUZE 4X4 16PLY ~~LOC~~+RFID DBL (SPONGE) ×3 IMPLANT
GLOVE BIOGEL PI IND STRL 8 (GLOVE) ×3 IMPLANT
GLOVE ORTHO TXT STRL SZ7.5 (GLOVE) ×6 IMPLANT
GOWN STRL REUS W/TWL LRG LVL3 (GOWN DISPOSABLE) ×3 IMPLANT
GOWN STRL REUS W/TWL XL LVL3 (GOWN DISPOSABLE) ×6 IMPLANT
HEMOSTAT ARISTA ABSORB 3G PWDR (HEMOSTASIS) IMPLANT
IRRIG SUCT STRYKERFLOW 2 WTIP (MISCELLANEOUS) ×2
IRRIGATION SUCT STRKRFLW 2 WTP (MISCELLANEOUS) ×3 IMPLANT
KIT PINK PAD W/HEAD ARE REST (MISCELLANEOUS) ×2
KIT PINK PAD W/HEAD ARM REST (MISCELLANEOUS) ×3 IMPLANT
KIT TURNOVER CYSTO (KITS) ×3 IMPLANT
NDL INSUFFLATION 14GA 120MM (NEEDLE) ×3 IMPLANT
NDL SPNL 22GX3.5 QUINCKE BK (NEEDLE) ×3 IMPLANT
NEEDLE INSUFFLATION 14GA 120MM (NEEDLE) ×2 IMPLANT
NEEDLE SPNL 22GX3.5 QUINCKE BK (NEEDLE) ×2 IMPLANT
NS IRRIG 1000ML POUR BTL (IV SOLUTION) ×3 IMPLANT
OCCLUDER COLPOPNEUMO (BALLOONS) ×3 IMPLANT
PACK LAPAROSCOPY BASIN (CUSTOM PROCEDURE TRAY) ×3 IMPLANT
PROTECTOR NERVE ULNAR (MISCELLANEOUS) ×6 IMPLANT
SCISSORS LAP 5X35 DISP (ENDOMECHANICALS) IMPLANT
SET IRRIG Y TYPE TUR BLADDER L (SET/KITS/TRAYS/PACK) IMPLANT
SET TRI-LUMEN FLTR TB AIRSEAL (TUBING) ×3 IMPLANT
SHEARS HARMONIC 36 ACE (MISCELLANEOUS) IMPLANT
SLEEVE SCD COMPRESS KNEE MED (STOCKING) ×3 IMPLANT
SUT ENDO VLOC 180-0-8IN (SUTURE) IMPLANT
SUT VIC AB 0 CT1 36 (SUTURE) IMPLANT
SUT VIC AB 4-0 PS2 27 (SUTURE) ×3 IMPLANT
SUT VICRYL 0 UR6 27IN ABS (SUTURE) ×3 IMPLANT
SUT VLOC 180 0 9IN GS21 (SUTURE) ×3 IMPLANT
SYR 10ML LL (SYRINGE) ×3 IMPLANT
SYR 50ML LL SCALE MARK (SYRINGE) ×3 IMPLANT
SYR CONTROL 10ML LL (SYRINGE) ×3 IMPLANT
TIP UTERINE 5.1X6CM LAV DISP (MISCELLANEOUS) IMPLANT
TIP UTERINE 6.7X10CM GRN DISP (MISCELLANEOUS) IMPLANT
TIP UTERINE 6.7X6CM WHT DISP (MISCELLANEOUS) IMPLANT
TIP UTERINE 6.7X8CM BLUE DISP (MISCELLANEOUS) IMPLANT
TOWEL OR 17X24 6PK STRL BLUE (TOWEL DISPOSABLE) ×3 IMPLANT
TRAY FOLEY W/BAG SLVR 14FR LF (SET/KITS/TRAYS/PACK) ×3 IMPLANT
TROCAR PORT AIRSEAL 5X120 (TROCAR) ×3 IMPLANT
TROCAR Z-THREAD BLADED 11X100M (TROCAR) ×3 IMPLANT
TROCAR Z-THREAD FIOS 5X100MM (TROCAR) ×3 IMPLANT
WARMER LAPAROSCOPE (MISCELLANEOUS) ×3 IMPLANT

## 2023-06-03 NOTE — Progress Notes (Signed)
Ambulated from the stretcher to the bathroom then to bed.

## 2023-06-03 NOTE — Progress Notes (Signed)
Post-op check, s/p TLH/BSO  Doing well, pain ok, left eye bothering her more than anything else.  Tol PO, no n/v, has ambulated and voided.  Wants to stay here tonight Afeb, VSS Abd- soft, incisions intact  Doing well post-op, will keep overnight and discharge tomorrow am, continue routine care

## 2023-06-03 NOTE — Progress Notes (Signed)
Warm compress to left eye.

## 2023-06-03 NOTE — Interval H&P Note (Signed)
History and Physical Interval Note:  06/03/2023 7:16 AM  Angela Huff  has presented today for surgery, with the diagnosis of leiomyoma of uterus.  The various methods of treatment have been discussed with the patient and family. After consideration of risks, benefits and other options for treatment, the patient has consented to  Procedure(s): TOTAL LAPAROSCOPIC HYSTERECTOMY WITH SALPINGECTOMY (Bilateral) CYSTOSCOPY (N/A) and bilateral oophorectomy as a surgical intervention.  The patient's history has been reviewed, patient examined, no change in status, stable for surgery.  I have reviewed the patient's chart and labs.  Questions were answered to the patient's satisfaction.     Leighton Roach Ulrick Methot

## 2023-06-03 NOTE — Transfer of Care (Signed)
Immediate Anesthesia Transfer of Care Note  Patient: Angela Huff  Procedure(s) Performed: Procedure(s) (LRB): TOTAL LAPAROSCOPIC HYSTERECTOMY WITH SALPINGECTOMY (Bilateral) CYSTOSCOPY (N/A)  Patient Location: PACU  Anesthesia Type: General  Level of Consciousness: awake, oriented, sedated and patient cooperative  Airway & Oxygen Therapy: Patient Spontanous Breathing and Patient connected to face mask oxygen  Post-op Assessment: Report given to PACU RN and Post -op Vital signs reviewed and stable  Post vital signs: Reviewed and stable  Complications: No apparent anesthesia complications Last Vitals:  Vitals Value Taken Time  BP 126/83 06/03/23 1015  Temp 36.4 C 06/03/23 1004  Pulse 81 06/03/23 1016  Resp 11 06/03/23 1016  SpO2 96 % 06/03/23 1016  Vitals shown include unfiled device data.  Last Pain:  Vitals:   06/03/23 1004  TempSrc:   PainSc: 0-No pain      Patients Stated Pain Goal: 3 (06/03/23 1004)  Complications: No notable events documented.

## 2023-06-03 NOTE — Progress Notes (Signed)
Patient c/o eye discomfort RN spoke with Dr Okey Dupre orders put in for cornea abrasion.

## 2023-06-03 NOTE — Op Note (Signed)
Preoperative diagnosis: Symptomatic myomatous uterus Postoperative diagnosis: Same  Procedure: Total laparoscopic hysterectomy, bilateral salpingooophorectomy, cystoscopy Surgeon: Lavina Hamman M.D.  Assistant: Doristine Counter, D.O. Anesthesia: Gen. Endotracheal tube  Findings: She had an essentially normal pelvis, enlarged uterus with large pedunculated fundal myoma, normal tubes and ovaries.  Assistance from Dr. Curtis Sites was necessary to take down the pedicles from her side and help with retraction. Specimens: Uterus, tubes and ovaries for routine pathology, weight in the operating room was 470 grams Estimated blood loss: 100 cc  Complications: None  Procedure in detail:  The patient was taken to the operating room and placed in the dorsosupine position. General anesthesia was induced. Arms were tucked to her sides and legs were placed in mobile stirrups. Abdomen perineum and vagina were then prepped and draped in usual sterile fashion. A 6cm RUMI with the medium sized metal cup was then applied to the uterus and cervix for uterine manipulation and a Foley catheter was inserted. Infraumbilical skin was infiltrated with quarter percent Marcaine and a 1 cm horizontal incision was made. A veress needle was inserted into the peritoneal cavity and placement confirmed by the water drop test an opening pressure of 3 mm of mercury. CO2 was insufflated to a pressure of 13 mm mercury and the veress needle was removed. A 5 mm trocar was then introduced with direct visualization with the laparoscope. A 5 mm airseal port was then also placed on the right side and a 10/11 trocar placed on the left side under direct visualization. Inspection revealed the above-mentioned findings. The distal right fallopian tube was grasped with a grasper from the left side. The Harmonic scalpel Ace was used to take down the right IP ligament and mesosalpinx and then the round ligament and broad ligament were then also taken down. The  anterior peritoneum was incised across the anterior portion of the uterus to help release the bladder. Uterine artery artery was skeletonized and taken down with the harmonic scalpel Ace with adequate division and adequate hemostasis. A similar procedure was then performed on the patient's left side taking down the IP ligament and mesosalpinx, then the round ligament, and broad ligament. Anterior peritoneum was incised across the anterior portion the uterus to meet the incision coming from the patient's right side, taking down some adhesions along the way. Uterine artery was skeletonized and taken down with the Harmonic Scalpel with adequate division and adequate hemostasis. I then began to detach the cervix from the vagina. The RUMI cup was pushed superiorly, the Harmonic scalpel was placed on the cup edge and a circumferential incision was made starting anteriorly into the vagina. This released the uterosacral ligaments as well as all attachments to the vagina. At this point all pedicles appeared to be hemostatic and there was no other pathology noted.  The uterus was pulled into the vagina, however, the pedunculated fundal myoma would not go.  The fundal myoma was halved using harmonic scalpel, and the half that was attached to the uterus was removed vaginally with the uterus.  The remaining myoma was stuffed into the vagina.  The pelvis was found to be hemostatic.  The vaginal cuff was then closed with the Endostitch device using 0 V-lok suture.  The pelvis was again suctioned and found to be hemostatic with good closure of the vaginal cuff.  The lateral ports were removed under direct visualization and all gas was allowed to deflate from the abdomen. The umbilical trocar was then removed. A 0 Vicryl suture was placed  as deep as possible on the left side where the 10/11 port was placed.  Skin incisions were closed with interrupted subcuticular sutures of 4-0 Vicryl followed by Dermabond.   Attention was turned  vaginally.  The Foley catheter was removed.  A 70 degree cystoscope was inserted and 200 cc of fluid was instilled into the bladder.  The bladder was normal and urine flowed freely from each ureteral orifice.  The fluid was then drained from the bladder.  The myoma was then removed from the vagina.  The patient tolerated the procedure well. She was taken to the recovery in stable condition. Counts were correct x2, she received Anmcef 2 gm IV at the beginning of the procedure and had PAS hose on throughout the procedure.

## 2023-06-03 NOTE — Anesthesia Procedure Notes (Signed)
Procedure Name: Intubation Date/Time: 06/03/2023 7:40 AM  Performed by: Francie Massing, CRNAPre-anesthesia Checklist: Patient identified, Emergency Drugs available, Suction available and Patient being monitored Patient Re-evaluated:Patient Re-evaluated prior to induction Oxygen Delivery Method: Circle system utilized Preoxygenation: Pre-oxygenation with 100% oxygen Induction Type: IV induction Ventilation: Mask ventilation without difficulty Laryngoscope Size: Mac and 3 Grade View: Grade I Tube type: Oral Number of attempts: 1 Airway Equipment and Method: Stylet and Oral airway Placement Confirmation: ETT inserted through vocal cords under direct vision, positive ETCO2 and breath sounds checked- equal and bilateral Secured at: 22 cm Tube secured with: Tape Dental Injury: Teeth and Oropharynx as per pre-operative assessment

## 2023-06-03 NOTE — Anesthesia Postprocedure Evaluation (Signed)
Anesthesia Post Note  Patient: Angela Huff  Procedure(s) Performed: TOTAL LAPAROSCOPIC HYSTERECTOMY WITH SALPINGECTOMY (Bilateral: Abdomen) CYSTOSCOPY (Bladder)     Patient location during evaluation: Other Anesthesia Type: General Level of consciousness: awake and alert Pain management: pain level controlled Vital Signs Assessment: post-procedure vital signs reviewed and stable Respiratory status: spontaneous breathing, nonlabored ventilation, respiratory function stable and patient connected to nasal cannula oxygen Cardiovascular status: blood pressure returned to baseline and stable Postop Assessment: no apparent nausea or vomiting Anesthetic complications: no  No notable events documented.  Last Vitals:  Vitals:   06/03/23 1125 06/03/23 1224  BP: 128/84 129/88  Pulse: 76 82  Resp: 14 13  Temp: 36.7 C 36.7 C  SpO2: 95% 96%    Last Pain:  Vitals:   06/03/23 1125  TempSrc:   PainSc: 3                  Elester Apodaca S

## 2023-06-04 DIAGNOSIS — D259 Leiomyoma of uterus, unspecified: Secondary | ICD-10-CM | POA: Diagnosis not present

## 2023-06-04 LAB — SURGICAL PATHOLOGY

## 2023-06-04 MED ORDER — KETOROLAC TROMETHAMINE 0.5 % OP SOLN: 1.0000 [drp] | Freq: Four times a day (QID) | OPHTHALMIC | Status: DC

## 2023-06-04 MED ORDER — OXYCODONE HCL 5 MG PO TABS
ORAL_TABLET | ORAL | Status: AC
  Filled 2023-06-04: qty 2

## 2023-06-04 MED ORDER — GABAPENTIN 300 MG PO CAPS: 300.0000 mg | ORAL_CAPSULE | Freq: Three times a day (TID) | ORAL | 0 refills | Status: AC

## 2023-06-04 MED ORDER — IBUPROFEN 600 MG PO TABS: 600.0000 mg | ORAL_TABLET | Freq: Four times a day (QID) | ORAL | 0 refills | Status: AC

## 2023-06-04 MED ORDER — KETOROLAC TROMETHAMINE 30 MG/ML IJ SOLN
INTRAMUSCULAR | Status: AC
  Filled 2023-06-04: qty 1

## 2023-06-04 MED ORDER — OXYCODONE HCL 5 MG PO TABS
ORAL_TABLET | ORAL | Status: AC
  Filled 2023-06-04: qty 1

## 2023-06-04 MED ORDER — OXYCODONE HCL 5 MG PO TABS: 5.0000 mg | ORAL_TABLET | ORAL | 0 refills | Status: AC | PRN

## 2023-06-04 MED ORDER — ACETAMINOPHEN 500 MG PO TABS
ORAL_TABLET | ORAL | Status: AC
  Filled 2023-06-04: qty 2

## 2023-06-04 MED ORDER — BSS IO SOLN: 15.0000 mL | Freq: Once | INTRAOCULAR | Status: DC

## 2023-06-04 NOTE — Discharge Instructions (Signed)
Routine instructions for laparoscopic hysterectomy OTC Tylenol, Ibuprofen, then oxycodone prn pain

## 2023-06-04 NOTE — Progress Notes (Signed)
1 Day Post-Op Procedure(s) (LRB): TOTAL LAPAROSCOPIC HYSTERECTOMY WITH SALPINGECTOMY (Bilateral) CYSTOSCOPY (N/A)  Subjective: Patient reports incisional pain, tolerating PO, and no problems voiding.    Objective: I have reviewed patient's vital signs and intake and output.  General: alert GI: soft, mild tenderness, incisions intact  Assessment: s/p Procedure(s): TOTAL LAPAROSCOPIC HYSTERECTOMY WITH SALPINGECTOMY (Bilateral) CYSTOSCOPY (N/A): stable and progressing well  Plan: Encourage ambulation Discharge home  LOS: 0 days    Zenaida Niece, MD 06/04/2023, 7:21 AM

## 2023-06-04 NOTE — Discharge Summary (Signed)
Physician Discharge Summary  Patient ID: Angela Huff MRN: [id number] DOB/AGE: [birth year] 56 y.o.  Admit date: 06/03/2023 Discharge date: 06/04/2023  Admission Diagnoses:  Symptomatic myomatous uterus  Discharge Diagnoses: Same Principal Problem:   Uterine leiomyoma Active Problems:   S/P laparoscopic hysterectomy   Discharged Condition: good  Hospital Course: She underwent TLH/BSO cystoscopy without complications.  Possible left corneal abrasion.  No post-op complications, stable for discharge morning POD #1   Discharge Exam: Blood pressure 115/80, pulse 77, temperature 98.7 F (37.1 C), resp. rate 16, height 5' 7.5" (1.715 m), weight 83.1 kg, SpO2 94%. General appearance: alert  Disposition: Discharge disposition: 01-Home or Self Care       Discharge Instructions     Call MD for:  persistant dizziness or light-headedness   Complete by: As directed    Call MD for:  persistant nausea and vomiting   Complete by: As directed    Call MD for:  severe uncontrolled pain   Complete by: As directed    Call MD for:  temperature >100.4   Complete by: As directed    Diet - low sodium heart healthy   Complete by: As directed    Increase activity slowly   Complete by: As directed    Lifting restrictions   Complete by: As directed    10 lbs      Allergies as of 06/04/2023       Reactions   Codeine Nausea And Vomiting        Medication List     TAKE these medications    albuterol 108 (90 Base) MCG/ACT inhaler Commonly known as: VENTOLIN HFA albuterol sulfate HFA 90 mcg/actuation aerosol inhaler  INHALE 1 TO 2 PUFFS BY MOUTH EVERY 4 HOURS AS NEEDED   clotrimazole-betamethasone cream Commonly known as: LOTRISONE clotrimazole-betamethasone 1 %-0.05 % topical cream  APPLY TO THE AFFECTED AND SURROUNDING AREAS OF SKIN BY TOPICAL ROUTE 2 TIMES PER DAY IN THE MORNING AND EVENING FOR 2 WEEKS   gabapentin 300 MG capsule Commonly known as: NEURONTIN Take 1  capsule (300 mg total) by mouth 3 (three) times daily for 2 days.   hydrochlorothiazide 12.5 MG capsule Commonly known as: MICROZIDE hydrochlorothiazide 12.5 mg capsule   ibuprofen 600 MG tablet Commonly known as: ADVIL Take 1 tablet (600 mg total) by mouth every 6 (six) hours.   MELATONIN ER PO Take by mouth.   oxyCODONE 5 MG immediate release tablet Commonly known as: Oxy IR/ROXICODONE Take 1 tablet (5 mg total) by mouth every 4 (four) hours as needed for severe pain (pain score 7-10).   PriLOSEC OTC 20 MG tablet Generic drug: omeprazole Take by mouth as needed.   prochlorperazine 5 MG tablet Commonly known as: COMPAZINE Take 2 tablets (10 mg total) by mouth every 8 (eight) hours as needed for nausea or vomiting.   Pulmicort Flexhaler 90 MCG/ACT inhaler Generic drug: Budesonide Inhale 1 puff into the lungs 2 (two) times daily. What changed: additional instructions   tamoxifen 20 MG tablet Commonly known as: NOLVADEX TAKE 1 TABLET DAILY   ZOFRAN PO Take by mouth as needed.        Follow-up Information     Rusell Meneely, MD. Schedule an appointment as soon as possible for a visit in 2 week(s).   Specialty: Obstetrics and Gynecology Contact information: [address] [place] [address] [phone]                 Signed: Leighton Roach  Ozzie Knobel 06/04/2023, 7:28 AM

## 2023-06-07 ENCOUNTER — Encounter (HOSPITAL_BASED_OUTPATIENT_CLINIC_OR_DEPARTMENT_OTHER): Payer: Self-pay | Admitting: Obstetrics and Gynecology

## 2023-06-07 NOTE — Plan of Care (Signed)
[facility] Tonsillectomy/Adenoidectomy, Postoperative PEDS care plan entered in error.

## 2023-06-24 ENCOUNTER — Inpatient Hospital Stay: Payer: 59 | Attending: Hematology and Oncology | Admitting: Hematology and Oncology

## 2023-06-24 VITALS — BP 136/90 | HR 127 | Temp 97.3°F | Resp 18 | Wt 183.4 lb

## 2023-06-24 DIAGNOSIS — Z800 Family history of malignant neoplasm of digestive organs: Secondary | ICD-10-CM | POA: Diagnosis not present

## 2023-06-24 DIAGNOSIS — Z1721 Progesterone receptor positive status: Secondary | ICD-10-CM | POA: Diagnosis not present

## 2023-06-24 DIAGNOSIS — Z90710 Acquired absence of both cervix and uterus: Secondary | ICD-10-CM | POA: Insufficient documentation

## 2023-06-24 DIAGNOSIS — H5710 Ocular pain, unspecified eye: Secondary | ICD-10-CM | POA: Insufficient documentation

## 2023-06-24 DIAGNOSIS — Z170 Estrogen receptor positive status [ER+]: Secondary | ICD-10-CM | POA: Insufficient documentation

## 2023-06-24 DIAGNOSIS — Z8051 Family history of malignant neoplasm of kidney: Secondary | ICD-10-CM | POA: Diagnosis not present

## 2023-06-24 DIAGNOSIS — C50411 Malignant neoplasm of upper-outer quadrant of right female breast: Secondary | ICD-10-CM

## 2023-06-24 DIAGNOSIS — R21 Rash and other nonspecific skin eruption: Secondary | ICD-10-CM | POA: Diagnosis not present

## 2023-06-24 DIAGNOSIS — Z803 Family history of malignant neoplasm of breast: Secondary | ICD-10-CM | POA: Insufficient documentation

## 2023-06-24 DIAGNOSIS — Z9011 Acquired absence of right breast and nipple: Secondary | ICD-10-CM | POA: Insufficient documentation

## 2023-06-24 DIAGNOSIS — Z79810 Long term (current) use of selective estrogen receptor modulators (SERMs): Secondary | ICD-10-CM | POA: Diagnosis not present

## 2023-06-24 MED ORDER — FLUCONAZOLE 200 MG PO TABS: 200.0000 mg | ORAL_TABLET | Freq: Every day | ORAL | 0 refills | Status: AC

## 2023-06-24 NOTE — Progress Notes (Signed)
[facility]  Telephone:(336) [phone] Fax:(336) [phone]    ID: Angela Huff DOB: Dec 25, 1965  MR#: [id number]  JYN#:[id number]  Patient Care Team: Daisy Floro, MD as PCP - General (Family Medicine) Pershing Proud, RN as Oncology Nurse Navigator Donnelly Angelica, RN as Oncology Nurse Navigator Magrinat, Valentino Hue, MD (Inactive) as Consulting Physician (Oncology) Almond Lint, MD as Consulting Physician (General Surgery) Huel Cote, MD as Consulting Physician (Obstetrics and Gynecology) Nita Sells, MD (Dermatology) Darryl Lent, MD as Referring Physician (Surgical Oncology) Celene Kras, MD (Plastic Surgery) Kimmick, Bonnetta Barry, MD as Referring Physician (Oncology) OTHER MD:   CHIEF COMPLAINT: 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  CASR c.1211T>G VUS identified.  The Multi-Gene Panel offered by [facility] includes sequencing and/or deletion duplication testing of the following 85 genes: AIP, ALK, APC, ATM, AXIN2,BAP1,  BARD1, BLM, BMPR1A, BRCA1, BRCA2, BRIP1, CASR, CDC73, CDH1, CDK4, CDKN1B, CDKN1C, CDKN2A (p14ARF), CDKN2A (p16INK4a), CEBPA, CHEK2, CTNNA1, DICER1, DIS3L2, EGFR (c.2369C>T, p.Thr790Met variant only), EPCAM (Deletion/duplication testing only), FH, FLCN, GATA2, GPC3, GREM1 (Promoter region deletion/duplication testing only), HOXB13 (c.251G>A, p.Gly84Glu), HRAS, KIT, MAX, MEN1, MET, MITF (c.952G>A,  p.Glu318Lys variant only), MLH1, MSH2, MSH3, MSH6, MUTYH, NBN, NF1, NF2, NTHL1, PALB2, PDGFRA, PHOX2B, PMS2, POLD1, POLE, POT1, PRKAR1A, PTCH1, PTEN, RAD50, RAD51C, RAD51D, RB1, RECQL4, RET, RNF43, RUNX1, SDHAF2, SDHA (sequence changes only), SDHB, SDHC, SDHD, SMAD4, SMARCA4, SMARCB1, SMARCE1, STK11, SUFU, TERC, TERT, TMEM127, TP53, TSC1, TSC2, VHL, WRN and WT1.  The report date is December 05, 2018.   11/2018 - 11/2023 Anti-estrogen oral therapy   Tamoxifen. Was stopped perioperatively and resumed on 04/18/2019.   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 ER+, PR+, HER2-.     INTERVAL HISTORY:   She is here for a follow up.    The patient, with a history of fibroids,breast cancer presents with a severe rash that developed about a week after a recent surgery. The rash, which initially improved with Diflucan and antihistamines, has since worsened. The rash is described as being all over the abdomen and extending to the legs. The patient also noticed a new spot on the rash. Despite the rash, the patient reports that the surgical incisions are not infected.  In addition to the rash, the patient experienced what she believes to be a hot flash. She describes it as a sudden onset of profuse sweating while doing normal activities. The patient also mentions that she has experienced similar symptoms at night prior to the surgery, suggesting she may be in perimenopause.  The patient also discusses her recent surgery for fibroids. She reports immediate relief after the surgery, despite the pain from the surgical procedure. She also mentions that she has not resumed taking tamoxifen, a medication she was taking  prior to the surgery.  Rest of the  pertinent 10 point ROS reviewed and negative.   COVID 19 VACCINATION STATUS: Pfizer x2, most recently 01/2020   PAST MEDICAL HISTORY: Past Medical History:  Diagnosis Date   Anxiety    no meds   Asthma    mild asthma per PFT on 03/30/22 in Epic   Cancer [facility]) 2020   DCIS right breast, Follows w/ [facility].   Depression    no meds   Family history of breast cancer    Family history of kidney cancer    Family history of stomach cancer    Fibroids    GERD (gastroesophageal reflux disease)    History of hiatal hernia    History of palpitations    Pt states this has been going on for twenty-five years. Pt was evaluated by doctor and was told it was nothing to worry about.   Hypertension    Follows w/ [facility].   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   Plantar fasciitis, bilateral 2024   per pt   PONV (postoperative nausea and vomiting)    Ptosis    Wears glasses   Mild allergic asthma   PAST SURGICAL HISTORY: Past Surgical History:  Procedure Laterality Date   BREAST RECONSTRUCTION  05/2019   COLONOSCOPY  2019   [facility]   CYSTOSCOPY N/A 06/03/2023   Procedure: CYSTOSCOPY;  Surgeon: Lavina Hamman, MD;  Location: [facility];  Service: Gynecology;  Laterality: N/A;   ENDOMETRIAL ABLATION  2015   with polyp removal   EYE SURGERY  02/2022   had a platinum piece removed that helped her blink   MASTECTOMY Right 2020   breast cancer   TOTAL LAPAROSCOPIC HYSTERECTOMY WITH SALPINGECTOMY Bilateral 06/03/2023   Procedure: TOTAL LAPAROSCOPIC HYSTERECTOMY WITH SALPINGECTOMY;  Surgeon: Lavina Hamman, MD;  Location: [facility];  Service: Gynecology;  Laterality: Bilateral;   TUMOR REMOVAL Left 2008   Schwannoma removed per pt   Schwannoma removal left facial nerve area 2008 Placement platinum weight left upper lid   FAMILY  HISTORY: Family History  Problem Relation Age of Onset   Asthma Mother    Skin cancer Father 30       d. 65   Kidney cancer Brother    Drug abuse Brother        d. 23   Breast cancer Maternal Grandmother 32       d. 35   Stomach cancer Paternal Grandmother 27   Skin cancer Paternal Grandmother    Heart Problems Paternal Uncle    Breast cancer Maternal Great-grandmother 20       MGF's mother  The patient's father died from slowly but relentlessly invasive basal cell carcinoma at the age of 60.  The patient's mother passed away unexpectedly in Jan 10, 2019 at age 43.  The patient has 1 brother who died at the age of 66 from a drug overdose, 2 older brothers, and 1 sister, with no history of cancer in the immediate family.  The patient's mother's mother was diagnosed with breast cancer at age 58 and died at age 83.  That grandmother had brothers, no sisters.    GYNECOLOGIC HISTORY:  No LMP recorded. Patient has had an ablation. 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  Her husband [name]") works in Print production planner for [employer].  Their children are [name], 25, who is teaching [place] in [place]; Ponderay, 23, who is home, and his son [name] 17 who is home.   ADVANCED DIRECTIVES: The patient's husband is her healthcare power of attorney   HEALTH MAINTENANCE: Social History   Tobacco Use   Smoking status: Never   Smokeless tobacco: Never  Vaping Use   Vaping status: Never Used  Substance Use Topics   Alcohol use: Not Currently   Drug use: Never    Colonoscopy: [facility], 2019  PAP: Up-to-date/[facility]  Bone density: Never  Allergies  Allergen Reactions   Codeine Nausea And Vomiting     Current Outpatient Medications  Medication Sig Dispense Refill   albuterol (PROVENTIL HFA;VENTOLIN HFA) 108 (90 Base) MCG/ACT inhaler albuterol sulfate HFA 90 mcg/actuation aerosol inhaler  INHALE 1 TO 2 PUFFS BY MOUTH EVERY 4 HOURS AS NEEDED     Budesonide (PULMICORT FLEXHALER) 90 MCG/ACT inhaler Inhale 1 puff into the lungs 2 (two) times daily. (Patient taking differently: Inhale 1 puff into the lungs 2 (two) times daily. Has never used per pt. But pt has to use if needed.) 1 each 5   clotrimazole-betamethasone (LOTRISONE) cream clotrimazole-betamethasone 1 %-0.05 % topical cream  APPLY TO THE AFFECTED AND SURROUNDING AREAS OF SKIN BY TOPICAL ROUTE 2 TIMES PER DAY IN THE MORNING AND EVENING FOR 2 WEEKS     gabapentin (NEURONTIN) 300 MG capsule Take 1 capsule (300 mg total) by mouth 3 (three) times daily for 2 days. 6 capsule 0   hydrochlorothiazide (MICROZIDE) 12.5 MG capsule hydrochlorothiazide 12.5 mg capsule     ibuprofen (ADVIL) 600 MG tablet Take 1 tablet (600 mg total) by mouth every 6 (six) hours. 30 tablet 0   MELATONIN ER PO Take by mouth.     omeprazole (PRILOSEC OTC) 20 MG tablet Take by mouth as needed.     Ondansetron HCl (ZOFRAN PO) Take by mouth as needed.     oxyCODONE (OXY IR/ROXICODONE) 5 MG immediate release tablet Take 1 tablet (5 mg total) by mouth every 4 (four) hours as needed for severe pain (pain score 7-10). 15 tablet 0   prochlorperazine (COMPAZINE) 5 MG tablet Take 2 tablets (10 mg total) by mouth every 8 (eight) hours as needed for nausea or vomiting. 60 tablet 1   tamoxifen (NOLVADEX) 20 MG tablet TAKE 1 TABLET DAILY 90 tablet 3   No current facility-administered medications for this visit.     OBJECTIVE:   Vitals:   06/24/23 1352  BP: (!) 136/90  Pulse: (!) 127  Resp: 18  Temp: (!) 97.3 F (36.3 C)  SpO2: 96%     Body mass index is 28.3 kg/m.   Wt Readings from Last 3 Encounters:  06/24/23 183 lb 6.4 oz (83.2 kg)  06/03/23 183 lb 1.6 oz (83.1  kg)  05/31/23 183 lb 2 oz (83.1 kg)      ECOG FS:  General: Alert, oriented and in no acute distress Skin maculopapular rash, scattered on the abdomen with some confluence in the groin.  Patient states it has improved but has gotten worse today.  She has noticed significant improvement for the first dose of fluconazole and  is hoping we can refill this.  She is taking hydroxyzine as needed   LAB RESULTS:  CMP     Component Value Date/Time   NA 140 06/03/2023 0611   K 3.8 06/03/2023 0611   CL 104 06/03/2023 0611   CO2 24 05/31/2023 0822   GLUCOSE 113 (H) 06/03/2023 0611   BUN 12 06/03/2023 0611   CREATININE 0.70 06/03/2023 0611   CREATININE 0.90 04/28/2023 1526   CALCIUM 8.5 (L) 05/31/2023 0822   PROT 6.7 05/31/2023 0822   ALBUMIN 3.6 05/31/2023 0822   AST 23 05/31/2023 0822   AST 20 04/28/2023 1526   ALT 22 05/31/2023 0822   ALT 17 04/28/2023 1526   ALKPHOS 49 05/31/2023 0822   BILITOT 0.7 05/31/2023 0822   BILITOT 0.4 04/28/2023 1526   GFRNONAA >60 05/31/2023 0822   GFRNONAA >60 04/28/2023 1526   GFRAA >60 10/02/2019 0847    No results found for: "TOTALPROTELP", "ALBUMINELP", "A1GS", "A2GS", "BETS", "BETA2SER", "GAMS", "MSPIKE", "SPEI"  No results found for: "KPAFRELGTCHN", "LAMBDASER", "KAPLAMBRATIO"  Lab Results  Component Value Date   WBC 8.1 05/31/2023   NEUTROABS 5.0 04/28/2023   HGB 14.3 06/03/2023   HCT 42.0 06/03/2023   MCV 90.9 05/31/2023   PLT 285 05/31/2023   No results found for: "LABCA2"  No components found for: "[id number]"  No results for input(s): "INR" in the last 168 hours.  No results found for: "LABCA2"  No results found for: "GNF621"  No results found for: "CAN125"  No results found for: "CAN153"  No results found for: "CA2729"  No components found for: "HGQUANT"  No results found for: "CEA1", "CEA" / No results found for: "CEA1", "CEA"   No results found for: "AFPTUMOR"  No results found for: "CHROMOGRNA"  No results  found for: "HGBA", "HGBA2QUANT", "HGBFQUANT", "HGBSQUAN" (Hemoglobinopathy evaluation)   No results found for: "LDH"  No results found for: "IRON", "TIBC", "IRONPCTSAT" (Iron and TIBC)  No results found for: "FERRITIN"  Urinalysis No results found for: "COLORURINE", "APPEARANCEUR", "LABSPEC", "PHURINE", "GLUCOSEU", "HGBUR", "BILIRUBINUR", "KETONESUR", "PROTEINUR", "UROBILINOGEN", "NITRITE", "LEUKOCYTESUR"   STUDIES:  No results found.   ELIGIBLE FOR AVAILABLE RESEARCH PROTOCOL: NO   ASSESSMENT: 57 y.o. [place], [place] woman status post right breast upper outer quadrant biopsy 11/15/2018 showing ductal carcinoma in situ, grade 2, strongly estrogen and progesterone receptor positive  (a) biopsy of a lower inner quadrant area of calcifications 11/15/2018 showed flat epithelial atypia  (b) biopsy of right breast lower inner and upper outer quadrants 12/09/2018 both showed atypical ductal hyperplasia status post right mastectomy and sentinel lymph node sampling 03/07/2019 for a pT1a pN0, stage IA invasive mucinous carcinoma, grade 2 with negative margins, prognostic panel not obtained status post immediate stacked DIEP reconstruction, started on Tamoxifen 11/28/2018, resumed 04/18/2019, stopped perioperatively, no indication for adjuvant radiation who is here for follow up on Tamoxifen.  PLAN:  Patient is currently on tamoxifen for adjuvant antiestrogen therapy and tolerating it well.   She will complete 5 years of antiestrogen therapy in summer 2025.   Okay to resume tamoxifen.  Postoperative Rash Developed a widespread rash postoperatively, initially improved with Diflucan but has since worsened. Possible allergic reaction to surgical disinfectant. -Prescribe another course of Diflucan. -If no improvement, consider a short course of oral steroids.  Eye Injury Patient reports eye pain postoperatively, possibly due to tape used during anesthesia. -Advise patient to continue with  prescribed eye drops and warm compresses.  Breast Cancer Postoperative follow-up after removal of benign fibroids. Patient  reports immediate relief post-surgery. -Resume Tamoxifen. -Schedule mammogram within the next month.  Follow-up in September 2025 for annual review. If any issues arise before then, patient to contact the office. Total time spent: 30 minutes.  *Total Encounter Time as defined by the Centers for Medicare and Medicaid Services includes, in addition to the face-to-face time of a patient visit (documented in the note above) non-face-to-face time: obtaining and reviewing outside history, ordering and reviewing medications, tests or procedures, care coordination (communications with other health care professionals or caregivers) and documentation in the medical record.

## 2023-08-24 DIAGNOSIS — H2513 Age-related nuclear cataract, bilateral: Secondary | ICD-10-CM | POA: Diagnosis not present

## 2023-08-24 DIAGNOSIS — H524 Presbyopia: Secondary | ICD-10-CM | POA: Diagnosis not present

## 2023-08-24 DIAGNOSIS — H52203 Unspecified astigmatism, bilateral: Secondary | ICD-10-CM | POA: Diagnosis not present

## 2023-08-24 DIAGNOSIS — H5213 Myopia, bilateral: Secondary | ICD-10-CM | POA: Diagnosis not present

## 2023-08-24 DIAGNOSIS — H04123 Dry eye syndrome of bilateral lacrimal glands: Secondary | ICD-10-CM | POA: Diagnosis not present

## 2023-08-24 DIAGNOSIS — H43811 Vitreous degeneration, right eye: Secondary | ICD-10-CM | POA: Diagnosis not present

## 2023-09-27 DIAGNOSIS — H52222 Regular astigmatism, left eye: Secondary | ICD-10-CM | POA: Diagnosis not present

## 2023-09-27 DIAGNOSIS — H25812 Combined forms of age-related cataract, left eye: Secondary | ICD-10-CM | POA: Diagnosis not present

## 2023-09-27 DIAGNOSIS — H2512 Age-related nuclear cataract, left eye: Secondary | ICD-10-CM | POA: Diagnosis not present

## 2023-10-11 DIAGNOSIS — H2511 Age-related nuclear cataract, right eye: Secondary | ICD-10-CM | POA: Diagnosis not present

## 2023-10-11 DIAGNOSIS — H25811 Combined forms of age-related cataract, right eye: Secondary | ICD-10-CM | POA: Diagnosis not present

## 2023-11-19 DIAGNOSIS — E559 Vitamin D deficiency, unspecified: Secondary | ICD-10-CM | POA: Diagnosis not present

## 2023-11-19 DIAGNOSIS — R7301 Impaired fasting glucose: Secondary | ICD-10-CM | POA: Diagnosis not present

## 2023-11-19 DIAGNOSIS — Z13220 Encounter for screening for lipoid disorders: Secondary | ICD-10-CM | POA: Diagnosis not present

## 2023-11-19 DIAGNOSIS — Z0000 Encounter for general adult medical examination without abnormal findings: Secondary | ICD-10-CM | POA: Diagnosis not present

## 2024-04-03 DIAGNOSIS — Z85828 Personal history of other malignant neoplasm of skin: Secondary | ICD-10-CM | POA: Diagnosis not present

## 2024-04-03 DIAGNOSIS — L918 Other hypertrophic disorders of the skin: Secondary | ICD-10-CM | POA: Diagnosis not present

## 2024-04-03 DIAGNOSIS — L821 Other seborrheic keratosis: Secondary | ICD-10-CM | POA: Diagnosis not present

## 2024-04-03 DIAGNOSIS — D225 Melanocytic nevi of trunk: Secondary | ICD-10-CM | POA: Diagnosis not present

## 2024-04-03 DIAGNOSIS — Z08 Encounter for follow-up examination after completed treatment for malignant neoplasm: Secondary | ICD-10-CM | POA: Diagnosis not present

## 2024-04-10 DIAGNOSIS — I10 Essential (primary) hypertension: Secondary | ICD-10-CM | POA: Diagnosis not present

## 2024-04-10 DIAGNOSIS — N3090 Cystitis, unspecified without hematuria: Secondary | ICD-10-CM | POA: Diagnosis not present

## 2024-04-10 DIAGNOSIS — J4520 Mild intermittent asthma, uncomplicated: Secondary | ICD-10-CM | POA: Diagnosis not present

## 2024-04-10 DIAGNOSIS — E7800 Pure hypercholesterolemia, unspecified: Secondary | ICD-10-CM | POA: Diagnosis not present

## 2024-04-13 DIAGNOSIS — E559 Vitamin D deficiency, unspecified: Secondary | ICD-10-CM | POA: Diagnosis not present

## 2024-04-13 DIAGNOSIS — I10 Essential (primary) hypertension: Secondary | ICD-10-CM | POA: Diagnosis not present

## 2024-04-13 DIAGNOSIS — E7800 Pure hypercholesterolemia, unspecified: Secondary | ICD-10-CM | POA: Diagnosis not present

## 2024-04-26 ENCOUNTER — Other Ambulatory Visit: Payer: Self-pay

## 2024-04-26 DIAGNOSIS — D0511 Intraductal carcinoma in situ of right breast: Secondary | ICD-10-CM

## 2024-04-26 DIAGNOSIS — Z170 Estrogen receptor positive status [ER+]: Secondary | ICD-10-CM

## 2024-04-27 ENCOUNTER — Inpatient Hospital Stay (HOSPITAL_BASED_OUTPATIENT_CLINIC_OR_DEPARTMENT_OTHER): Payer: 59 | Admitting: Hematology and Oncology

## 2024-04-27 ENCOUNTER — Inpatient Hospital Stay: Payer: 59 | Attending: Hematology and Oncology

## 2024-04-27 VITALS — BP 122/88 | HR 96 | Temp 98.2°F | Resp 17 | Wt 192.5 lb

## 2024-04-27 DIAGNOSIS — D0511 Intraductal carcinoma in situ of right breast: Secondary | ICD-10-CM

## 2024-04-27 DIAGNOSIS — Z808 Family history of malignant neoplasm of other organs or systems: Secondary | ICD-10-CM | POA: Diagnosis not present

## 2024-04-27 DIAGNOSIS — Z8051 Family history of malignant neoplasm of kidney: Secondary | ICD-10-CM | POA: Diagnosis not present

## 2024-04-27 DIAGNOSIS — Z90710 Acquired absence of both cervix and uterus: Secondary | ICD-10-CM | POA: Diagnosis not present

## 2024-04-27 DIAGNOSIS — Z170 Estrogen receptor positive status [ER+]: Secondary | ICD-10-CM | POA: Diagnosis not present

## 2024-04-27 DIAGNOSIS — Z1732 Human epidermal growth factor receptor 2 negative status: Secondary | ICD-10-CM | POA: Diagnosis not present

## 2024-04-27 DIAGNOSIS — Z1721 Progesterone receptor positive status: Secondary | ICD-10-CM | POA: Diagnosis not present

## 2024-04-27 DIAGNOSIS — C50411 Malignant neoplasm of upper-outer quadrant of right female breast: Secondary | ICD-10-CM | POA: Insufficient documentation

## 2024-04-27 DIAGNOSIS — Z800 Family history of malignant neoplasm of digestive organs: Secondary | ICD-10-CM | POA: Insufficient documentation

## 2024-04-27 DIAGNOSIS — Z9011 Acquired absence of right breast and nipple: Secondary | ICD-10-CM | POA: Insufficient documentation

## 2024-04-27 DIAGNOSIS — Z79810 Long term (current) use of selective estrogen receptor modulators (SERMs): Secondary | ICD-10-CM | POA: Diagnosis not present

## 2024-04-27 DIAGNOSIS — Z803 Family history of malignant neoplasm of breast: Secondary | ICD-10-CM | POA: Insufficient documentation

## 2024-04-27 DIAGNOSIS — Z807 Family history of other malignant neoplasms of lymphoid, hematopoietic and related tissues: Secondary | ICD-10-CM | POA: Diagnosis not present

## 2024-04-27 LAB — CBC WITH DIFFERENTIAL (CANCER CENTER ONLY)
Abs Immature Granulocytes: 0.02 K/uL (ref 0.00–0.07)
Basophils Absolute: 0.1 K/uL (ref 0.0–0.1)
Basophils Relative: 1 %
Eosinophils Absolute: 0.1 K/uL (ref 0.0–0.5)
Eosinophils Relative: 2 %
HCT: 40.6 % (ref 36.0–46.0)
Hemoglobin: 14 g/dL (ref 12.0–15.0)
Immature Granulocytes: 0 %
Lymphocytes Relative: 36 %
Lymphs Abs: 3.2 K/uL (ref 0.7–4.0)
MCH: 29.9 pg (ref 26.0–34.0)
MCHC: 34.5 g/dL (ref 30.0–36.0)
MCV: 86.6 fL (ref 80.0–100.0)
Monocytes Absolute: 0.7 K/uL (ref 0.1–1.0)
Monocytes Relative: 8 %
Neutro Abs: 4.6 K/uL (ref 1.7–7.7)
Neutrophils Relative %: 53 %
Platelet Count: 288 K/uL (ref 150–400)
RBC: 4.69 MIL/uL (ref 3.87–5.11)
RDW: 12.1 % (ref 11.5–15.5)
WBC Count: 8.7 K/uL (ref 4.0–10.5)
nRBC: 0 % (ref 0.0–0.2)

## 2024-04-27 LAB — CMP (CANCER CENTER ONLY)
ALT: 27 U/L (ref 0–44)
AST: 30 U/L (ref 15–41)
Albumin: 4 g/dL (ref 3.5–5.0)
Alkaline Phosphatase: 61 U/L (ref 38–126)
Anion gap: 9 (ref 5–15)
BUN: 10 mg/dL (ref 6–20)
CO2: 26 mmol/L (ref 22–32)
Calcium: 9.1 mg/dL (ref 8.9–10.3)
Chloride: 104 mmol/L (ref 98–111)
Creatinine: 0.86 mg/dL (ref 0.44–1.00)
GFR, Estimated: >60 mL/min (ref >60)
Glucose, Bld: 95 mg/dL (ref 70–99)
Potassium: 3.6 mmol/L (ref 3.5–5.1)
Sodium: 139 mmol/L (ref 135–145)
Total Bilirubin: 0.6 mg/dL (ref 0.0–1.2)
Total Protein: 6.9 g/dL (ref 6.5–8.1)

## 2024-04-27 NOTE — Progress Notes (Unsigned)
 [facility]  Telephone:([phone]) [phone] Fax:(336) [phone]    ID: Angela Huff DOB: August 19, 1965  MR#: [id number]  RDW#:[id number]  Patient Care Team: Okey Carlin Redbird, MD as PCP - General (Family Medicine) Tyree Nanetta SAILOR, RN as Oncology Nurse Navigator Aron Shoulders, MD as Consulting Physician (General Surgery) Estelle Service, MD as Consulting Physician (Obstetrics and Gynecology) Shona Rush, MD (Dermatology) Betha Ernie Florence, MD as Referring Physician (Surgical Oncology) Josie Hamilton, MD (Plastic Surgery) Kimmick, Truman Govern, MD as Referring Physician (Oncology) OTHER MD:   CHIEF COMPLAINT: 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  CASR c.1211T>G VUS identified.  The Multi-Gene Panel offered by [facility] includes sequencing and/or deletion duplication testing of the following 85 genes: AIP, ALK, APC, ATM, AXIN2,BAP1,  BARD1, BLM, BMPR1A, BRCA1, BRCA2, BRIP1, CASR, CDC73, CDH1, CDK4, CDKN1B, CDKN1C, CDKN2A (p14ARF), CDKN2A (p16INK4a), CEBPA, CHEK2, CTNNA1, DICER1, DIS3L2, EGFR (c.2369C>T, p.Thr790Met variant only), EPCAM (Deletion/duplication testing only), FH, FLCN, GATA2, GPC3, GREM1 (Promoter region deletion/duplication testing only), HOXB13 (c.251G>A, p.Gly84Glu), HRAS, KIT, MAX, MEN1, MET, MITF (c.952G>A, p.Glu318Lys variant only), MLH1, MSH2, MSH3, MSH6, MUTYH, NBN, NF1, NF2, NTHL1, PALB2, PDGFRA, PHOX2B, PMS2, POLD1, POLE,  POT1, PRKAR1A, PTCH1, PTEN, RAD50, RAD51C, RAD51D, RB1, RECQL4, RET, RNF43, RUNX1, SDHAF2, SDHA (sequence changes only), SDHB, SDHC, SDHD, SMAD4, SMARCA4, SMARCB1, SMARCE1, STK11, SUFU, TERC, TERT, TMEM127, TP53, TSC1, TSC2, VHL, WRN and WT1.  The report date is December 05, 2018.   11/2018 - 11/2023 Anti-estrogen oral therapy   Tamoxifen . Was stopped perioperatively and resumed on 04/18/2019.   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 ER+, PR+, HER2-.     INTERVAL HISTORY:   She is here for a follow up.     Rest of the pertinent 10 point ROS reviewed and negative.   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   Depression    no meds   Family history of breast cancer    Family history of kidney cancer    Family history of stomach cancer    Fibroids    GERD (gastroesophageal reflux disease)    History of hiatal hernia    History of palpitations    Pt states this has been going on for twenty-five years. Pt was evaluated by doctor and was told it was nothing to worry about.   Hypertension    Follows w/ [facility].   Ovarian cyst    bilateral   Paralysis (HCC) 2008   Patient had temporary  paralysis after tumor was removed from left side of face.   Plantar fasciitis, bilateral 2024   per pt   PONV (postoperative nausea and vomiting)    Ptosis    Wears glasses   Mild allergic asthma   PAST SURGICAL HISTORY: Past Surgical History:  Procedure Laterality Date   BREAST  RECONSTRUCTION  05/2019   COLONOSCOPY  2019   [facility]   CYSTOSCOPY N/A 06/03/2023   Procedure: CYSTOSCOPY;  Surgeon: Horacio Boas, MD;  Location: [facility];  Service: Gynecology;  Laterality: N/A;   ENDOMETRIAL ABLATION  2015   with polyp removal   EYE SURGERY  02/2022   had a platinum piece removed that helped her blink   MASTECTOMY Right 2020   breast cancer   TOTAL LAPAROSCOPIC HYSTERECTOMY WITH SALPINGECTOMY Bilateral 06/03/2023   Procedure: TOTAL LAPAROSCOPIC HYSTERECTOMY WITH SALPINGECTOMY;  Surgeon: Horacio Boas, MD;  Location: [facility];  Service: Gynecology;  Laterality: Bilateral;   TUMOR REMOVAL Left 2008   Schwannoma removed per pt   Schwannoma removal left facial nerve area 2008 Placement platinum weight left upper lid   FAMILY HISTORY: Family History  Problem Relation Age of Onset   Asthma Mother    Skin cancer Father 20       d. 65   Kidney cancer Brother    Drug abuse Brother        d. 79   Breast cancer Maternal Grandmother 32       d. 19   Stomach cancer Paternal Grandmother 76   Skin cancer Paternal Grandmother    Heart Problems Paternal Uncle    Breast cancer Maternal Great-grandmother 22       MGF's mother  The patient's father died from slowly but relentlessly invasive basal cell carcinoma at the age of 61.  The patient's mother passed away unexpectedly in 01-12-19 at age 56.  The patient has 1 brother who died at the age of 49 from a drug overdose, 2 older brothers, and 1 sister, with no history of cancer in the immediate family.  The patient's mother's mother was diagnosed with breast cancer at age 55 and died at age 40.  That grandmother had brothers, no sisters.    GYNECOLOGIC HISTORY:  No LMP recorded. Patient has had an ablation. 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  Her husband Roena Dietitian) works in Print production planner for [employer].  Their children are Nena, 25, who is teaching [place] in [place]; Ivan, 23, who is home, and his son Merilee 17 who is home.   ADVANCED DIRECTIVES: The patient's husband is her healthcare power of attorney   HEALTH MAINTENANCE: Social History   Tobacco Use   Smoking status: Never   Smokeless tobacco: Never  Vaping Use   Vaping status: Never Used  Substance Use Topics   Alcohol use: Not Currently   Drug use: Never    Colonoscopy: [facility], 2019  PAP: Up-to-date/[facility]  Bone density: Never  Allergies  Allergen Reactions   Codeine Nausea And Vomiting    Current Outpatient Medications  Medication Sig Dispense Refill   albuterol  (PROVENTIL  HFA;VENTOLIN  HFA) 108 (90 Base) MCG/ACT inhaler albuterol  sulfate HFA  90 mcg/actuation aerosol inhaler  INHALE 1 TO 2 PUFFS BY MOUTH EVERY 4 HOURS AS NEEDED     Budesonide  (PULMICORT  FLEXHALER) 90 MCG/ACT inhaler Inhale 1 puff into the lungs 2 (two) times daily. (Patient taking differently: Inhale 1 puff into the lungs 2 (two) times daily. Has never used per pt. But pt has to use if needed.) 1 each 5   clotrimazole-betamethasone (LOTRISONE) cream clotrimazole-betamethasone 1 %-0.05 % topical cream  APPLY TO THE AFFECTED AND SURROUNDING AREAS OF SKIN BY TOPICAL ROUTE 2 TIMES PER DAY IN THE MORNING AND EVENING FOR 2 WEEKS     fluconazole  (DIFLUCAN ) 200 MG tablet Take 1 tablet (200 mg total) by mouth daily. 3 tablet 0   gabapentin  (NEURONTIN ) 300 MG capsule Take 1 capsule (300 mg total) by mouth 3 (three) times daily for 2 days. 6 capsule 0   hydrochlorothiazide  (MICROZIDE ) 12.5 MG capsule hydrochlorothiazide  12.5 mg capsule     ibuprofen  (ADVIL ) 600 MG tablet Take 1 tablet (600 mg total) by mouth every 6 (six) hours. 30 tablet 0    MELATONIN ER PO Take by mouth.     omeprazole (PRILOSEC OTC) 20 MG tablet Take by mouth as needed.     Ondansetron  HCl (ZOFRAN  PO) Take by mouth as needed.     oxyCODONE  (OXY IR/ROXICODONE ) 5 MG immediate release tablet Take 1 tablet (5 mg total) by mouth every 4 (four) hours as needed for severe pain (pain score 7-10). 15 tablet 0   prochlorperazine  (COMPAZINE ) 5 MG tablet Take 2 tablets (10 mg total) by mouth every 8 (eight) hours as needed for nausea or vomiting. 60 tablet 1   tamoxifen  (NOLVADEX ) 20 MG tablet TAKE 1 TABLET DAILY 90 tablet 3   No current facility-administered medications for this visit.     OBJECTIVE:   There were no vitals filed for this visit.    There is no height or weight on file to calculate BMI.   Wt Readings from Last 3 Encounters:  06/24/23 183 lb 6.4 oz (83.2 kg)  06/03/23 183 lb 1.6 oz (83.1 kg)  05/31/23 183 lb 2 oz (83.1 kg)      ECOG FS:  General: Alert, oriented and in no acute distress Skin maculopapular rash, scattered on the abdomen with some confluence in the groin.  Patient states it has improved but has gotten worse today.  She has noticed significant improvement for the first dose of fluconazole  and is hoping we can refill this.  She is taking hydroxyzine as needed   LAB RESULTS:  CMP     Component Value Date/Time   NA 140 06/03/2023 0611   K 3.8 06/03/2023 0611   CL 104 06/03/2023 0611   CO2 24 05/31/2023 0822   GLUCOSE 113 (H) 06/03/2023 0611   BUN 12 06/03/2023 0611   CREATININE 0.70 06/03/2023 0611   CREATININE 0.90 04/28/2023 1526   CALCIUM 8.5 (L) 05/31/2023 0822   PROT 6.7 05/31/2023 0822   ALBUMIN 3.6 05/31/2023 0822   AST 23 05/31/2023 0822   AST 20 04/28/2023 1526   ALT 22 05/31/2023 0822   ALT 17 04/28/2023 1526   ALKPHOS 49 05/31/2023 0822   BILITOT 0.7 05/31/2023 0822   BILITOT 0.4 04/28/2023 1526   GFRNONAA >60 05/31/2023 0822   GFRNONAA >60 04/28/2023 1526   GFRAA >60 10/02/2019 0847    No results found  for: TOTALPROTELP, ALBUMINELP, A1GS, A2GS, BETS, BETA2SER, GAMS, MSPIKE, SPEI  No results found for: KPAFRELGTCHN, LAMBDASER, KAPLAMBRATIO  Lab  Results  Component Value Date   WBC 8.7 04/27/2024   NEUTROABS 4.6 04/27/2024   HGB 14.0 04/27/2024   HCT 40.6 04/27/2024   MCV 86.6 04/27/2024   PLT 288 04/27/2024   No results found for: LABCA2  No components found for: [id number]  No results for input(s): INR in the last 168 hours.  No results found for: LABCA2  No results found for: RJW800  No results found for: CAN125  No results found for: CAN153  No results found for: CA2729  No components found for: HGQUANT  No results found for: CEA1, CEA / No results found for: CEA1, CEA   No results found for: AFPTUMOR  No results found for: CHROMOGRNA  No results found for: HGBA, HGBA2QUANT, HGBFQUANT, HGBSQUAN (Hemoglobinopathy evaluation)   No results found for: LDH  No results found for: IRON, TIBC, IRONPCTSAT (Iron and TIBC)  No results found for: FERRITIN  Urinalysis No results found for: COLORURINE, APPEARANCEUR, LABSPEC, PHURINE, GLUCOSEU, HGBUR, BILIRUBINUR, KETONESUR, PROTEINUR, UROBILINOGEN, NITRITE, LEUKOCYTESUR   STUDIES:  No results found.   ELIGIBLE FOR AVAILABLE RESEARCH PROTOCOL: NO   ASSESSMENT: 58 y.o. 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  PLAN:  Patient is currently on tamoxifen  for adjuvant antiestrogen therapy and tolerating it well.   She will complete 5 years of antiestrogen therapy in summer 2025.   Okay to resume tamoxifen .  Postoperative Rash Developed a widespread rash postoperatively, initially improved with Diflucan  but has since worsened. Possible allergic reaction to surgical disinfectant. -Prescribe another course of Diflucan . -If no improvement, consider a short course of oral steroids.  Eye Injury Patient reports eye pain postoperatively, possibly due to tape used during anesthesia. -Advise patient to continue with prescribed eye drops and warm compresses.  Breast Cancer Postoperative follow-up after removal of benign fibroids. Patient reports immediate relief post-surgery. -Resume Tamoxifen . -Schedule mammogram within the next month.  Follow-up in September 2025 for annual review. If any issues arise before then, patient to contact the office. Total time spent: 30 minutes.  *Total Encounter Time as defined by the Centers for Medicare and Medicaid Services includes, in addition to the face-to-face time of a patient visit (documented in the note above) non-face-to-face time: obtaining and reviewing outside history, ordering and reviewing medications, tests or procedures, care coordination (communications with other health care professionals or caregivers) and documentation in the medical record.

## 2024-08-04 DIAGNOSIS — C50411 Malignant neoplasm of upper-outer quadrant of right female breast: Secondary | ICD-10-CM | POA: Diagnosis not present

## 2024-08-04 DIAGNOSIS — Z170 Estrogen receptor positive status [ER+]: Secondary | ICD-10-CM | POA: Diagnosis not present

## 2024-08-04 DIAGNOSIS — Z9011 Acquired absence of right breast and nipple: Secondary | ICD-10-CM | POA: Diagnosis not present

## 2024-08-04 DIAGNOSIS — Z9889 Other specified postprocedural states: Secondary | ICD-10-CM | POA: Diagnosis not present

## 2024-08-04 DIAGNOSIS — Z853 Personal history of malignant neoplasm of breast: Secondary | ICD-10-CM | POA: Diagnosis not present

## 2024-08-04 DIAGNOSIS — Z08 Encounter for follow-up examination after completed treatment for malignant neoplasm: Secondary | ICD-10-CM | POA: Diagnosis not present

## 2024-08-04 DIAGNOSIS — Z1379 Encounter for other screening for genetic and chromosomal anomalies: Secondary | ICD-10-CM | POA: Diagnosis not present
